# Patient Record
Sex: Male | Born: 1955 | Race: White | Hispanic: No | Marital: Single | Smoking: Current every day smoker
Health system: Southern US, Community
[De-identification: ages and names within clinical notes are randomized; demographics above are authoritative.]

## PROBLEM LIST (undated history)

## (undated) DIAGNOSIS — Z98811 Dental restoration status: Secondary | ICD-10-CM

## (undated) DIAGNOSIS — F329 Major depressive disorder, single episode, unspecified: Secondary | ICD-10-CM

## (undated) DIAGNOSIS — Z8614 Personal history of Methicillin resistant Staphylococcus aureus infection: Secondary | ICD-10-CM

## (undated) DIAGNOSIS — F1010 Alcohol abuse, uncomplicated: Secondary | ICD-10-CM

## (undated) DIAGNOSIS — Z86010 Personal history of colonic polyps: Secondary | ICD-10-CM

## (undated) DIAGNOSIS — R21 Rash and other nonspecific skin eruption: Secondary | ICD-10-CM

## (undated) DIAGNOSIS — K4190 Unilateral femoral hernia, without obstruction or gangrene, not specified as recurrent: Secondary | ICD-10-CM

## (undated) HISTORY — DX: Alcohol abuse, uncomplicated: F10.10

## (undated) HISTORY — PX: INGUINAL HERNIA REPAIR: SUR1180

## (undated) HISTORY — DX: Personal history of colonic polyps: Z86.010

## (undated) HISTORY — DX: Major depressive disorder, single episode, unspecified: F32.9

---

## 2001-08-29 DIAGNOSIS — Z8614 Personal history of Methicillin resistant Staphylococcus aureus infection: Secondary | ICD-10-CM

## 2001-08-29 HISTORY — DX: Personal history of Methicillin resistant Staphylococcus aureus infection: Z86.14

## 2003-08-30 HISTORY — PX: OTHER SURGICAL HISTORY: SHX169

## 2007-07-02 ENCOUNTER — Encounter: Payer: Self-pay | Admitting: Internal Medicine

## 2007-07-02 LAB — CONVERTED CEMR LAB: PSA: 1.12 ng/mL

## 2007-07-12 LAB — HM COLONOSCOPY

## 2007-08-13 ENCOUNTER — Encounter: Payer: Self-pay | Admitting: Internal Medicine

## 2007-09-27 ENCOUNTER — Encounter: Payer: Self-pay | Admitting: Internal Medicine

## 2007-11-15 ENCOUNTER — Encounter: Payer: Self-pay | Admitting: Internal Medicine

## 2008-01-16 ENCOUNTER — Encounter: Payer: Self-pay | Admitting: Internal Medicine

## 2008-04-14 ENCOUNTER — Encounter: Payer: Self-pay | Admitting: Internal Medicine

## 2008-07-09 ENCOUNTER — Encounter: Payer: Self-pay | Admitting: Internal Medicine

## 2009-03-14 ENCOUNTER — Encounter: Payer: Self-pay | Admitting: Internal Medicine

## 2010-05-17 ENCOUNTER — Encounter: Payer: Self-pay | Admitting: Internal Medicine

## 2010-05-17 DIAGNOSIS — F3289 Other specified depressive episodes: Secondary | ICD-10-CM

## 2010-05-17 DIAGNOSIS — F411 Generalized anxiety disorder: Secondary | ICD-10-CM | POA: Insufficient documentation

## 2010-05-17 DIAGNOSIS — F32A Depression, unspecified: Secondary | ICD-10-CM | POA: Insufficient documentation

## 2010-05-17 DIAGNOSIS — F329 Major depressive disorder, single episode, unspecified: Secondary | ICD-10-CM

## 2010-05-17 DIAGNOSIS — Z860100 Personal history of colon polyps, unspecified: Secondary | ICD-10-CM

## 2010-05-17 HISTORY — DX: Personal history of colon polyps, unspecified: Z86.0100

## 2010-05-17 HISTORY — DX: Other specified depressive episodes: F32.89

## 2010-05-17 HISTORY — DX: Major depressive disorder, single episode, unspecified: F32.9

## 2010-05-17 HISTORY — DX: Personal history of colonic polyps: Z86.010

## 2010-06-04 ENCOUNTER — Ambulatory Visit: Payer: Self-pay | Admitting: Internal Medicine

## 2010-06-04 ENCOUNTER — Encounter: Payer: Self-pay | Admitting: Internal Medicine

## 2010-06-04 DIAGNOSIS — F1010 Alcohol abuse, uncomplicated: Secondary | ICD-10-CM

## 2010-06-04 HISTORY — DX: Alcohol abuse, uncomplicated: F10.10

## 2010-06-04 LAB — CONVERTED CEMR LAB
Alkaline Phosphatase: 83 units/L (ref 39–117)
BUN: 15 mg/dL (ref 6–23)
Basophils Absolute: 0 10*3/uL (ref 0.0–0.1)
Bilirubin, Direct: 0.1 mg/dL (ref 0.0–0.3)
CO2: 33 meq/L — ABNORMAL HIGH (ref 19–32)
Calcium: 9.5 mg/dL (ref 8.4–10.5)
Chloride: 104 meq/L (ref 96–112)
Cholesterol: 188 mg/dL (ref 0–200)
Creatinine, Ser: 0.7 mg/dL (ref 0.4–1.5)
Eosinophils Absolute: 0.1 10*3/uL (ref 0.0–0.7)
Glucose, Bld: 61 mg/dL — ABNORMAL LOW (ref 70–99)
Hemoglobin, Urine: NEGATIVE
Ketones, ur: NEGATIVE mg/dL
LDL Cholesterol: 119 mg/dL — ABNORMAL HIGH (ref 0–99)
Lymphocytes Relative: 18.6 % (ref 12.0–46.0)
MCHC: 34.8 g/dL (ref 30.0–36.0)
MCV: 93.5 fL (ref 78.0–100.0)
Monocytes Absolute: 0.6 10*3/uL (ref 0.1–1.0)
Neutrophils Relative %: 73.6 % (ref 43.0–77.0)
PSA: 0.99 ng/mL (ref 0.10–4.00)
Platelets: 195 10*3/uL (ref 150.0–400.0)
RDW: 13.3 % (ref 11.5–14.6)
Specific Gravity, Urine: <=1.005 (ref 1.000–1.030)
Total Protein, Urine: NEGATIVE mg/dL
Total Protein: 6.5 g/dL (ref 6.0–8.3)
Triglycerides: 125 mg/dL (ref 0.0–149.0)
Urine Glucose: NEGATIVE mg/dL
Urobilinogen, UA: 0.2 (ref 0.0–1.0)
VLDL: 25 mg/dL (ref 0.0–40.0)

## 2010-06-14 ENCOUNTER — Ambulatory Visit: Payer: Self-pay | Admitting: Internal Medicine

## 2010-06-14 DIAGNOSIS — H60339 Swimmer's ear, unspecified ear: Secondary | ICD-10-CM

## 2010-09-28 NOTE — Assessment & Plan Note (Signed)
Summary: EAR INFECTION/NWS   Vital Signs:  Patient profile:   55 year old male Height:      71 inches Weight:      147 pounds BMI:     20.58 O2 Sat:      99 % on Room air Temp:     97.8 degrees F oral Pulse rate:   68 / minute BP sitting:   120 / 72  (left arm) Cuff size:   regular  Vitals Entered By: Zella Ball Ewing CMA Duncan Dull) (June 14, 2010 10:26 AM)  O2 Flow:  Room air CC: ear pain/RE   CC:  ear pain/RE.  History of Present Illness: here with acute onset mild to mod x 2 -3 days fever, headache, general malaise and weakness; assoc right ear pain, swelling and mild d/c;  no ST , cough and Pt denies CP, worsening sob, doe, wheezing, orthopnea, pnd, worsening LE edema, palps, dizziness or syncope  Pt denies new neuro symptoms such as headache, facial or extremity weakness  Pt denies polydipsia, polyuria.    Problems Prior to Update: 1)  Preventive Health Care  (ICD-V70.0) 2)  Alcohol Abuse  (ICD-305.00) 3)  Colonic Polyps, Hx of  (ICD-V12.72) 4)  Depression  (ICD-311) 5)  Anxiety  (ICD-300.00)  Medications Prior to Update: 1)  Lexapro 20 Mg Tabs (Escitalopram Oxalate) .... 1/2 Po Once Daily  Current Medications (verified): 1)  Lexapro 20 Mg Tabs (Escitalopram Oxalate) .... 1/2 Po Once Daily 2)  Neomycin-Polymyxin-Hc 3.5-10000-1 Susp (Neomycin-Polymyxin-Hc) .... 2 Gtt Left Ear Four Times Per Day For 10 Days 3)  Ciprofloxacin Hcl 500 Mg Tabs (Ciprofloxacin Hcl) .Marland Kitchen.. 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 , mild ill  Head:  normocephalic and atraumatic.   Eyes:  vision grossly intact, pupils equal, and pupils round.   Ears:  right ext canal with 2+ swelling, redness and d/c;  left canal clear, sinus nontender Nose:  no external deformity and no nasal discharge.   Mouth:  no gingival abnormalities and pharynx pink and moist.   Neck:  supple and no masses.   Lungs:  normal respiratory effort and normal breath sounds.   Heart:  normal rate and regular rhythm.   Extremities:  no edema, no erythema    Impression & Recommendations:  Problem # 1:  OTITIS EXTERNA, ACUTE, RIGHT (ICD-380.12)  His updated medication list for this problem includes:    Neomycin-polymyxin-hc 3.5-10000-1 Susp (Neomycin-polymyxin-hc) .Marland Kitchen... 2 gtt left ear four times per day for 10 days treat as above, f/u any worsening signs or symptoms , and cipro due to severity  Complete Medication List: 1)  Lexapro 20 Mg Tabs (Escitalopram oxalate) .Marland KitchenMarland KitchenMarland Kitchen  1/2 po once daily 2)  Neomycin-polymyxin-hc 3.5-10000-1 Susp (Neomycin-polymyxin-hc) .... 2 gtt left ear four times per day for 10 days 3)  Ciprofloxacin Hcl 500 Mg Tabs (Ciprofloxacin hcl) .Marland Kitchen.. 1po two times a day  Patient Instructions: 1)  Please take all new medications as prescribed 2)  Continue all previous medications as before this visit  3)  Please schedule a follow-up appointment as needed. Prescriptions: CIPROFLOXACIN HCL 500 MG TABS (CIPROFLOXACIN HCL) 1po two times a day  #20 x 0   Entered and Authorized by:   Corwin Levins MD   Signed by:   Corwin Levins MD on 06/14/2010   Method used:   Print then Give to Patient   RxID:    [id number] NEOMYCIN-POLYMYXIN-HC 3.5-10000-1 SUSP [facility]) 2 gtt left ear four times per day for 10 days  #1bottle x 1   Entered and Authorized by:   Corwin Levins MD   Signed by:   Corwin Levins MD on 06/14/2010   Method used:   Print then Give to Patient   RxID:   [id number]    Orders Added: 1)  Est. Patient Level III [84696]

## 2010-09-28 NOTE — Miscellaneous (Signed)
  Clinical Lists Changes  Problems: Added new problem of ANXIETY (ICD-300.00) Added new problem of DEPRESSION (ICD-311) Added new problem of COLONIC POLYPS, HX OF (ICD-V12.72) Observations: Added new observation of FAMILY HX: mother - lung cancer father - unknown  (05/17/2010 13:44) Added new observation of SOCIAL HX: Current Smoker Alcohol use-yes Drug use-no work - [employer]  (05/17/2010 13:44) Added new observation of DRUG USE: no (05/17/2010 13:44) Added new observation of ALCOHOL COMM: yes (05/17/2010 13:44) Added new observation of SMOK STATUS: current (05/17/2010 13:44) Added new observation of INGUINAL HER: yes (05/17/2010 13:44) Added new observation of PAST SURG HX: stress test neg for ischemia  - jan 2005 Inguinal herniorrhaphy  (05/17/2010 13:44) Added new observation of PMH COLONPYP: yes (05/17/2010 13:44) Added new observation of PAST MED HX: Anxiety Depression Colonic polyps, hx of - adenomatous Jul 12, 2007 Nov 2008 - HIV neg, Hep C - neg  (05/17/2010 13:44) Added new observation of DEPRESSION: yes (05/17/2010 13:44) Added new observation of PMH ANXIETY: yes (05/17/2010 13:44) Added new observation of COLONOSCOPY: Adenomatous Polyp (07/12/2007 13:50) Added new observation of PSA: 1.12 ng/mL (07/02/2007 13:51) Added new observation of TD BOOSTER: Tdap  (04/30/2007 13:46)      Preventive Care Screening  Colonoscopy:    Date:  07/12/2007    Results:  Adenomatous Polyp  PSA:    Date:  07/02/2007    Results:  1.12 ng/mL  Last Tetanus Booster:    Date:  04/30/2007    Results:  Tdap    Past History:  Past Medical History: Anxiety Depression Colonic polyps, hx of - adenomatous Jul 12, 2007 Nov 2008 - HIV neg, Hep C - neg  Past Surgical History: stress test neg for ischemia  - jan 2005 Inguinal herniorrhaphy   Family History: mother - lung cancer father - unknown  Social History: Current Smoker Alcohol use-yes 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

## 2010-09-28 NOTE — Assessment & Plan Note (Signed)
Summary: NEW [employer] PT-PKG--STC   Vital Signs:  Patient profile:   55 year old male Height:      71 inches Weight:      147.38 pounds BMI:     20.63 O2 Sat:      92 % on Room air Temp:     97.7 degrees F oral Pulse rate:   81 / minute BP sitting:   108 / 62  (left arm) Cuff size:   regular  Vitals Entered By: Zella Ball Ewing CMA Duncan Dull) (June 04, 2010 9:26 AM)  O2 Flow:  Room air  Preventive Care Screening  Colonoscopy:    Next Due:  06/2012  CC: New Patient, [employer]/RE   CC:  New Patient and [employer]/RE.  History of Present Illness: here to establish with wellness, has ongoing depression, but even lately more agitated, angry, irritable lately  over small things, no suicidal ideation, no panic attacks;  no illict drug use, quite ETOH, still smoking but thinking about quitting smoking.   Was on lexapro - quit himself over a yr ago, then quit his job, lost insurance, and could no longer afford.  Now with health insurance adn his other expensives are currently  lower.  Pt denies CP, worsening sob, doe, wheezing, orthopnea, pnd, worsening LE edema, palps, dizziness or syncope  Pt denies new neuro symptoms such as headache, facial or extremity weakness  Pt denies polydipsia, polyuria  Overall good compliance with meds, trying to follow low chol, DM diet, wt stable, little excercise however .  Cureently lost his driving licesne related to DUI.    Problems Prior to Update: 1)  Preventive Health Care  (ICD-V70.0) 2)  Alcohol Abuse  (ICD-305.00) 3)  Colonic Polyps, Hx of  (ICD-V12.72) 4)  Depression  (ICD-311) 5)  Anxiety  (ICD-300.00)  Medications Prior to Update: 1)  None  Current Medications (verified): 1)  Lexapro 20 Mg Tabs (Escitalopram Oxalate) .... 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 stress test neg for ischemia  - jan 2005 Inguinal herniorrhaphy  Family History: Reviewed history from 05/17/2010 and no changes required. mother - lung cancer father - unknown  Social History: Reviewed history from 05/17/2010 and no changes required. 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  all otherwise negative per pt -    Physical Exam  General:  alert and underweight appearing.   Head:  normocephalic and atraumatic.   Eyes:  vision grossly intact, pupils equal, and pupils round.   Ears:  R ear normal and L ear normal.   Nose:  no external deformity and no nasal discharge.   Mouth:  no gingival abnormalities and pharynx pink and moist.   Neck:  supple and no masses.   Lungs:  normal respiratory effort and  normal breath sounds.   Heart:  normal rate and regular rhythm.   Abdomen:  soft, non-tender, and normal bowel sounds.   Msk:  no joint tenderness and no joint swelling.   Extremities:  no edema, no erythema  Neurologic:  cranial nerves II-XII intact, strength normal in all extremities, and gait normal.   Psych:  dysphoric affect and moderately anxious.     Impression & Recommendations:  Problem # 1:  Preventive Health Care (ICD-V70.0)  Overall doing well, age appropriate education and counseling updated and referral for appropriate preventive services done unless declined, immunizations up to date or declined, diet counseling done if overweight, urged to quit smoking if smokes , most recent labs reviewed and current ordered if appropriate, ecg reviewed or declined (interpretation per ECG scanned in the EMR if done); information regarding Medicare Prevention requirements given if appropriate; speciality referrals updated as appropriate   Orders: EKG w/ Interpretation (93000) TLB-BMP (Basic Metabolic Panel-BMET) (80048-METABOL) TLB-CBC Platelet - w/Differential (85025-CBCD) TLB-Hepatic/Liver Function Pnl (80076-HEPATIC) TLB-TSH (Thyroid Stimulating Hormone) (84443-TSH) TLB-PSA (Prostate Specific Antigen) (84153-PSA) TLB-Lipid Panel (80061-LIPID) TLB-Udip ONLY (81003-UDIP)  Problem # 2:  DEPRESSION (ICD-311)  His updated medication list for this problem includes:    Lexapro 20 Mg Tabs (Escitalopram oxalate) .Marland Kitchen... 1/2 po once daily treat as above, f/u any worsening signs or symptoms   Complete Medication List: 1)  Lexapro 20 Mg Tabs (Escitalopram oxalate) .... 1/2 po once daily  Other Orders: Admin 1st Vaccine (16109) Flu Vaccine 64yrs + [phone])  Patient Instructions: 1)  you had the flu shot today 2)  Please go to the Lab in the basement for your blood and/or urine tests today  3)  Please call the number on the [facility] Card for results of your testing  4)  Please take all new  medications as prescribed 5)  Remember to take only HALF of the lexapro 6)  Please schedule a follow-up appointment in 1 year or sooner if needed Prescriptions: LEXAPRO 20 MG TABS (ESCITALOPRAM OXALATE) 1po once daily  #90 x 3   Entered and Authorized by:   Corwin Levins MD   Signed by:   Corwin Levins MD on 06/04/2010   Method used:   Print then Give to Patient   RxID:   [id number]    Immunization History:  Pneumovax Immunization History:    Pneumovax:  historical (05/29/2008)     Flu Vaccine Consent Questions     Do you have a history of severe allergic reactions to this vaccine? no    Any prior history of allergic reactions to egg and/or gelatin? no    Do you have a sensitivity to the preservative Thimersol? no    Do you have a past history of Guillan-Barre Syndrome? no    Do you currently have an acute febrile illness? no    Have you ever had a severe reaction to latex? no    Vaccine information given and explained to patient? yes  Are you currently pregnant? no    Lot Number:[id number]   Exp Date:02/26/2011   Site Given  Left Deltoid IMu

## 2010-09-28 NOTE — Letter (Signed)
Summary: [facility]   Imported By: Lester [place] 05/19/2010 10:44:50  _____________________________________________________________________  External Attachment:    Type:   Image     Comment:   External Document

## 2010-09-28 NOTE — Letter (Signed)
Summary: [facility]   Imported By: Lester [place] 05/19/2010 10:40:58  _____________________________________________________________________  External Attachment:    Type:   Image     Comment:   External Document

## 2010-09-28 NOTE — Letter (Signed)
Summary: [facility]   Imported By: Lester [place] 05/19/2010 10:43:34  _____________________________________________________________________  External Attachment:    Type:   Image     Comment:   External Document

## 2010-09-28 NOTE — Letter (Signed)
Summary: [facility]   Imported By: Lester [place] 05/19/2010 10:42:22  _____________________________________________________________________  External Attachment:    Type:   Image     Comment:   External Document

## 2011-01-09 ENCOUNTER — Encounter: Payer: Self-pay | Admitting: Internal Medicine

## 2011-01-09 DIAGNOSIS — Z0000 Encounter for general adult medical examination without abnormal findings: Secondary | ICD-10-CM | POA: Insufficient documentation

## 2011-01-13 ENCOUNTER — Ambulatory Visit (INDEPENDENT_AMBULATORY_CARE_PROVIDER_SITE_OTHER): Payer: Managed Care, Other (non HMO) | Admitting: Internal Medicine

## 2011-01-13 ENCOUNTER — Other Ambulatory Visit (INDEPENDENT_AMBULATORY_CARE_PROVIDER_SITE_OTHER): Payer: Managed Care, Other (non HMO)

## 2011-01-13 ENCOUNTER — Encounter: Payer: Self-pay | Admitting: Internal Medicine

## 2011-01-13 VITALS — BP 130/70 | HR 85 | Temp 98.5°F | Ht 71.0 in | Wt 146.5 lb

## 2011-01-13 DIAGNOSIS — R5381 Other malaise: Secondary | ICD-10-CM

## 2011-01-13 DIAGNOSIS — Z0000 Encounter for general adult medical examination without abnormal findings: Secondary | ICD-10-CM

## 2011-01-13 DIAGNOSIS — R5383 Other fatigue: Secondary | ICD-10-CM

## 2011-01-13 DIAGNOSIS — F411 Generalized anxiety disorder: Secondary | ICD-10-CM

## 2011-01-13 DIAGNOSIS — G4700 Insomnia, unspecified: Secondary | ICD-10-CM | POA: Insufficient documentation

## 2011-01-13 LAB — CBC WITH DIFFERENTIAL/PLATELET
Basophils Absolute: 0.1 10*3/uL (ref 0.0–0.1)
Eosinophils Absolute: 0.3 10*3/uL (ref 0.0–0.7)
MCHC: 34.2 g/dL (ref 30.0–36.0)
MCV: 94.2 fl (ref 78.0–100.0)
Monocytes Absolute: 0.8 10*3/uL (ref 0.1–1.0)
Neutrophils Relative %: 66.3 % (ref 43.0–77.0)
Platelets: 187 10*3/uL (ref 150.0–400.0)
RDW: 13.9 % (ref 11.5–14.6)
WBC: 9 10*3/uL (ref 4.5–10.5)

## 2011-01-13 LAB — HEPATIC FUNCTION PANEL
Alkaline Phosphatase: 85 U/L (ref 39–117)
Bilirubin, Direct: 0.1 mg/dL (ref 0.0–0.3)
Total Protein: 6.4 g/dL (ref 6.0–8.3)

## 2011-01-13 LAB — TSH: TSH: 1.34 u[IU]/mL (ref 0.35–5.50)

## 2011-01-13 LAB — BASIC METABOLIC PANEL
BUN: 12 mg/dL (ref 6–23)
Chloride: 104 mEq/L (ref 96–112)
Creatinine, Ser: 0.6 mg/dL (ref 0.4–1.5)
Glucose, Bld: 53 mg/dL — ABNORMAL LOW (ref 70–99)

## 2011-01-13 MED ORDER — ZOLPIDEM TARTRATE 10 MG PO TABS: 10.0000 mg | ORAL_TABLET | Freq: Every evening | ORAL | Status: AC | PRN

## 2011-01-13 NOTE — Patient Instructions (Signed)
Take all new medications as prescribed Continue all other medications as before Please go to LAB in the Basement for the blood and/or urine tests to be done today Please call the phone number [phone] (the PhoneTree System) for results of testing in 2-3 days;  When calling, simply dial the number, and when prompted enter the MRN number above (the Medical Record Number) and the # key, then the message should start. Please return in 6 mo with Lab testing done 3-5 days before

## 2011-01-13 NOTE — Progress Notes (Signed)
Quick Note:  Voice message left on PhoneTree system - lab is negative, normal or otherwise stable, pt to continue same tx ______ 

## 2011-01-13 NOTE — Progress Notes (Signed)
  Subjective:    Patient ID: Jeff Moore, male    DOB: April 16, 1956, 55 y.o.   MRN: [id number]  HPI  Here to f/u with c/o extremem fatigue, works odd hrs at the produce dept at [employer] - sometimes works early am, mid-day hours, and sometimes evening hrs but no overnight hours;  Thinks he does not get enough sleep on the days he works the evenings since he cant go to sleep immed when get home;  Overall worse and "drug out" in the past month especially, very fatigued, "like I'm drunk" for 3-4 hrs in the AM;  Pt denies chest pain, increased sob or doe, wheezing, orthopnea, PND, increased LE swelling, palpitations, dizziness or syncope. Pt denies new neurological symptoms such as new headache, or facial or extremity weakness or numbness  Pt denies polydipsia, polyuria  Pt states overall good compliance with meds, trying to follow lower cholesterol diet,  wt overall stable but little exercise however.   Drinks ETOH very little recently, not more than usual, no other illicit drugs.  Overall good compliance with treatment, and good medicine tolerability. Denies worsening depressive symptoms, suicidal ideation, or panic, states lexapro works well for him.  No overt bleeding or bruising.   Denies urinary symptoms such as dysuria, frequency, urgency,or hematuria.   Past Medical History  Diagnosis Date  . ANXIETY 05/17/2010  . ALCOHOL ABUSE 06/04/2010  . DEPRESSION 05/17/2010  . COLONIC POLYPS, HX OF 05/17/2010  . Preventative health care 01/09/2011   Past Surgical History  Procedure Date  . Inguinal herniorrhapy   . Stress test negative for ischemia Jan. 2005    reports that he has been smoking.  He does not have any smokeless tobacco history on file. He reports that he does not drink alcohol or use illicit drugs. family history includes Cancer in his mother. No Known Allergies Current Outpatient Prescriptions on File Prior to Visit  Medication Sig Dispense Refill  . escitalopram (LEXAPRO) 20 MG tablet 1/2 by  mouth once daily       . ciprofloxacin (CIPRO) 500 MG tablet Take 500 mg by mouth 2 (two) times daily.        Marland Kitchen neomycin-polymyxin b-dexamethasone (MAXITROL) 3.5-10000-0.1 SUSP Place 2 drops into the left ear QID. For 10 days        Review of Systems All otherwise neg per pt     Objective:   Physical Exam BP 130/70  Pulse 85  Temp(Src) 98.5 F (36.9 C) (Oral)  Ht 5\' 11"  (1.803 m)  Wt 146 lb 8 oz (66.452 kg)  BMI 20.43 kg/m2  SpO2 98% Physical Exam  VS noted Constitutional: Pt appears well-developed and well-nourished.  HENT: Head: Normocephalic.  Right Ear: External ear normal.  Left Ear: External ear normal.  Eyes: Conjunctivae and EOM are normal. Pupils are equal, round, and reactive to light.  Neck: Normal range of motion. Neck supple.  Cardiovascular: Normal rate and regular rhythm.   Pulmonary/Chest: Effort normal and breath sounds normal.  Abd:  Soft, NT, non-distended, + BS Neurological: Pt is alert. No cranial nerve deficit.  Skin: Skin is warm. No erythema.  Psychiatric: Pt behavior is normal. Thought content normal. 1+ nervous        Assessment & Plan:

## 2011-01-16 ENCOUNTER — Encounter: Payer: Self-pay | Admitting: Internal Medicine

## 2011-01-16 NOTE — Assessment & Plan Note (Signed)
For tx with lexapro asd

## 2011-01-16 NOTE — Assessment & Plan Note (Addendum)
Etiology unclear, Exam otherwise benign, to check labs as documented, follow with expectant management  

## 2011-01-16 NOTE — Assessment & Plan Note (Signed)
tx with Remus Loffler, likely due to odd hours and stress,  to f/u any worsening symptoms or concerns

## 2011-06-07 ENCOUNTER — Telehealth: Payer: Self-pay

## 2011-06-07 NOTE — Telephone Encounter (Signed)
[facility]. Fluvirin Left Deltoid 0.33ml Date Administered 06/06/2011

## 2011-07-18 ENCOUNTER — Ambulatory Visit: Payer: Managed Care, Other (non HMO) | Admitting: Internal Medicine

## 2011-07-28 ENCOUNTER — Other Ambulatory Visit (INDEPENDENT_AMBULATORY_CARE_PROVIDER_SITE_OTHER): Payer: 59

## 2011-07-28 ENCOUNTER — Encounter: Payer: Self-pay | Admitting: Internal Medicine

## 2011-07-28 ENCOUNTER — Ambulatory Visit (INDEPENDENT_AMBULATORY_CARE_PROVIDER_SITE_OTHER): Payer: 59 | Admitting: Internal Medicine

## 2011-07-28 VITALS — BP 110/80 | HR 74 | Temp 98.4°F | Ht 71.0 in | Wt 145.5 lb

## 2011-07-28 DIAGNOSIS — Z0000 Encounter for general adult medical examination without abnormal findings: Secondary | ICD-10-CM

## 2011-07-28 DIAGNOSIS — R21 Rash and other nonspecific skin eruption: Secondary | ICD-10-CM

## 2011-07-28 LAB — CBC WITH DIFFERENTIAL/PLATELET
Basophils Relative: 0.3 % (ref 0.0–3.0)
Eosinophils Absolute: 0.1 10*3/uL (ref 0.0–0.7)
Eosinophils Relative: 1 % (ref 0.0–5.0)
Lymphocytes Relative: 16 % (ref 12.0–46.0)
Neutrophils Relative %: 76.3 % (ref 43.0–77.0)
Platelets: 207 10*3/uL (ref 150.0–400.0)
RBC: 4.81 Mil/uL (ref 4.22–5.81)
WBC: 10.3 10*3/uL (ref 4.5–10.5)

## 2011-07-28 LAB — LIPID PANEL
Cholesterol: 166 mg/dL (ref 0–200)
LDL Cholesterol: 97 mg/dL (ref 0–99)
VLDL: 9.2 mg/dL (ref 0.0–40.0)

## 2011-07-28 LAB — BASIC METABOLIC PANEL
BUN: 10 mg/dL (ref 6–23)
Chloride: 103 mEq/L (ref 96–112)
Creatinine, Ser: 0.8 mg/dL (ref 0.4–1.5)
Glucose, Bld: 91 mg/dL (ref 70–99)
Potassium: 4.4 mEq/L (ref 3.5–5.1)

## 2011-07-28 LAB — HEPATIC FUNCTION PANEL
ALT: 16 U/L (ref 0–53)
Alkaline Phosphatase: 76 U/L (ref 39–117)
Bilirubin, Direct: 0.1 mg/dL (ref 0.0–0.3)
Total Bilirubin: 0.7 mg/dL (ref 0.3–1.2)
Total Protein: 6.9 g/dL (ref 6.0–8.3)

## 2011-07-28 LAB — URINALYSIS, ROUTINE W REFLEX MICROSCOPIC
Leukocytes, UA: NEGATIVE
Nitrite: NEGATIVE
Specific Gravity, Urine: 1.01 (ref 1.000–1.030)
Total Protein, Urine: NEGATIVE
pH: 6 (ref 5.0–8.0)

## 2011-07-28 MED ORDER — ASPIRIN EC 81 MG PO TBEC: 81.0000 mg | DELAYED_RELEASE_TABLET | Freq: Every day | ORAL | Status: AC

## 2011-07-28 MED ORDER — ESCITALOPRAM OXALATE 20 MG PO TABS: 10.0000 mg | ORAL_TABLET | Freq: Every day | ORAL | Status: DC

## 2011-07-28 MED ORDER — TRIAMCINOLONE ACETONIDE 0.1 % EX CREA: TOPICAL_CREAM | Freq: Two times a day (BID) | CUTANEOUS | Status: AC

## 2011-07-28 NOTE — Patient Instructions (Addendum)
Take all new medications as prescribed - the cream, and the Aspirin 81 mg - 1 per day - COATED only Continue all other medications as before Please go to LAB in the Basement for the blood and/or urine tests to be done today Please call the phone number [phone] (the PhoneTree System) for results of testing in 2-3 days;  When calling, simply dial the number, and when prompted enter the MRN number above (the Medical Record Number) and the # key, then the message should start. Please return in 1 year for your yearly visit, or sooner if needed, with Lab testing done 3-5 days before

## 2011-07-31 ENCOUNTER — Encounter: Payer: Self-pay | Admitting: Internal Medicine

## 2011-07-31 DIAGNOSIS — R21 Rash and other nonspecific skin eruption: Secondary | ICD-10-CM | POA: Insufficient documentation

## 2011-07-31 NOTE — Progress Notes (Signed)
Subjective:    Patient ID: Jeff Moore, male    DOB: 07-21-1956, 55 y.o.   MRN: [id number]  HPI Here for wellness and f/u;  Overall doing ok;  Pt denies CP, worsening SOB, DOE, wheezing, orthopnea, PND, worsening LE edema, palpitations, dizziness or syncope.  Pt denies neurological change such as new Headache, facial or extremity weakness.  Pt denies polydipsia, polyuria, or low sugar symptoms. Pt states overall good compliance with treatment and medications, good tolerability, and trying to follow lower cholesterol diet.  Pt denies worsening depressive symptoms, suicidal ideation or panic. No fever, wt loss, night sweats, loss of appetite, or other constitutional symptoms.  Pt states good ability with ADL's, low fall risk, home safety reviewed and adequate, no significant changes in hearing or vision, and occasionally active with exercise.  Fatigue from last visit largely resolved, since he is now on dayshift.  Does have a rash rather large area to mid upper forehead, nontender, mild itchy Past Medical History  Diagnosis Date  . ANXIETY 05/17/2010  . ALCOHOL ABUSE 06/04/2010  . DEPRESSION 05/17/2010  . COLONIC POLYPS, HX OF 05/17/2010  . Preventative health care 01/09/2011   Past Surgical History  Procedure Date  . Inguinal herniorrhapy   . Stress test negative for ischemia Jan. 2005    reports that he has been smoking.  He does not have any smokeless tobacco history on file. He reports that he does not drink alcohol or use illicit drugs. family history includes Cancer in his mother. No Known Allergies No current outpatient prescriptions on file prior to visit.   Review of Systems Review of Systems  Constitutional: Negative for diaphoresis, activity change, appetite change and unexpected weight change.  HENT: Negative for hearing loss, ear pain, facial swelling, mouth sores and neck stiffness.   Eyes: Negative for pain, redness and visual disturbance.  Respiratory: Negative for shortness of  breath and wheezing.   Cardiovascular: Negative for chest pain and palpitations.  Gastrointestinal: Negative for diarrhea, blood in stool, abdominal distention and rectal pain.  Genitourinary: Negative for hematuria, flank pain and decreased urine volume.  Musculoskeletal: Negative for myalgias and joint swelling.  Skin: Negative for color change and wound.  Neurological: Negative for syncope and numbness.  Hematological: Negative for adenopathy.  Psychiatric/Behavioral: Negative for hallucinations, self-injury, decreased concentration and agitation.      Objective:   Physical Exam BP 110/80  Pulse 74  Temp(Src) 98.4 F (36.9 C) (Oral)  Ht 5\' 11"  (1.803 m)  Wt 145 lb 8 oz (65.998 kg)  BMI 20.29 kg/m2  SpO2 96% Physical Exam  VS noted Constitutional: Pt is oriented to person, place, and time. Appears well-developed and well-nourished.  HENT:  Head: Normocephalic and atraumatic.  Right Ear: External ear normal.  Left Ear: External ear normal.  Nose: Nose normal.  Mouth/Throat: Oropharynx is clear and moist.  Eyes: Conjunctivae and EOM are normal. Pupils are equal, round, and reactive to light.  Neck: Normal range of motion. Neck supple. No JVD present. No tracheal deviation present.  Cardiovascular: Normal rate, regular rhythm, normal heart sounds and intact distal pulses.   Pulmonary/Chest: Effort normal and breath sounds normal.  Abdominal: Soft. Bowel sounds are normal. There is no tenderness.  Musculoskeletal: Normal range of motion. Exhibits no edema.  Lymphadenopathy:  Has no cervical adenopathy.  Neurological: Pt is alert and oriented to person, place, and time. Pt has normal reflexes. No cranial nerve deficit.  Skin: Skin is warm and dry. No rash noted. except for  approx 3 cm erythema/pink nontender mid upper forehead without ulcer Psychiatric:  Has  normal mood and affect. Behavior is normal.     Assessment & Plan:

## 2011-07-31 NOTE — Assessment & Plan Note (Signed)
For triam cream, consider derm referral

## 2011-07-31 NOTE — Assessment & Plan Note (Signed)

## 2012-05-28 ENCOUNTER — Ambulatory Visit (INDEPENDENT_AMBULATORY_CARE_PROVIDER_SITE_OTHER): Payer: 59 | Admitting: Internal Medicine

## 2012-05-28 ENCOUNTER — Encounter (HOSPITAL_COMMUNITY): Payer: Self-pay | Admitting: Emergency Medicine

## 2012-05-28 ENCOUNTER — Encounter: Payer: Self-pay | Admitting: Internal Medicine

## 2012-05-28 ENCOUNTER — Emergency Department (HOSPITAL_COMMUNITY): Payer: 59

## 2012-05-28 ENCOUNTER — Emergency Department (HOSPITAL_COMMUNITY)
Admission: EM | Admit: 2012-05-28 | Discharge: 2012-05-28 | Disposition: A | Discharge status: Home / Self Care | Payer: 59 | Attending: Emergency Medicine | Admitting: Emergency Medicine

## 2012-05-28 VITALS — BP 112/80 | HR 87 | Temp 98.2°F | Ht 71.0 in | Wt 134.0 lb

## 2012-05-28 DIAGNOSIS — F411 Generalized anxiety disorder: Secondary | ICD-10-CM | POA: Insufficient documentation

## 2012-05-28 DIAGNOSIS — F329 Major depressive disorder, single episode, unspecified: Secondary | ICD-10-CM | POA: Insufficient documentation

## 2012-05-28 DIAGNOSIS — IMO0001 Reserved for inherently not codable concepts without codable children: Secondary | ICD-10-CM | POA: Insufficient documentation

## 2012-05-28 DIAGNOSIS — K4090 Unilateral inguinal hernia, without obstruction or gangrene, not specified as recurrent: Secondary | ICD-10-CM

## 2012-05-28 DIAGNOSIS — K4130 Unilateral femoral hernia, with obstruction, without gangrene, not specified as recurrent: Secondary | ICD-10-CM

## 2012-05-28 DIAGNOSIS — Z7982 Long term (current) use of aspirin: Secondary | ICD-10-CM | POA: Insufficient documentation

## 2012-05-28 DIAGNOSIS — Z79899 Other long term (current) drug therapy: Secondary | ICD-10-CM | POA: Insufficient documentation

## 2012-05-28 DIAGNOSIS — F3289 Other specified depressive episodes: Secondary | ICD-10-CM | POA: Insufficient documentation

## 2012-05-28 LAB — BASIC METABOLIC PANEL
BUN: 10 mg/dL (ref 6–23)
Calcium: 9 mg/dL (ref 8.4–10.5)
Creatinine, Ser: 0.72 mg/dL (ref 0.50–1.35)
GFR calc non Af Amer: >90 mL/min (ref >90)
Glucose, Bld: 100 mg/dL — ABNORMAL HIGH (ref 70–99)

## 2012-05-28 LAB — CBC WITH DIFFERENTIAL/PLATELET
Eosinophils Absolute: 0.1 10*3/uL (ref 0.0–0.7)
Eosinophils Relative: 2 % (ref 0–5)
Hemoglobin: 14.3 g/dL (ref 13.0–17.0)
Lymphs Abs: 1.6 10*3/uL (ref 0.7–4.0)
MCH: 32 pg (ref 26.0–34.0)
MCHC: 34.4 g/dL (ref 30.0–36.0)
MCV: 93.1 fL (ref 78.0–100.0)
Monocytes Absolute: 0.8 10*3/uL (ref 0.1–1.0)
Monocytes Relative: 13 % — ABNORMAL HIGH (ref 3–12)
RBC: 4.47 MIL/uL (ref 4.22–5.81)

## 2012-05-28 MED ORDER — OXYCODONE HCL 5 MG PO TABS: ORAL_TABLET | ORAL | Status: DC

## 2012-05-28 MED ORDER — IBUPROFEN 600 MG PO TABS: 600.0000 mg | ORAL_TABLET | Freq: Three times a day (TID) | ORAL | Status: AC | PRN

## 2012-05-28 MED ORDER — HYDROMORPHONE HCL PF 1 MG/ML IJ SOLN
1.0000 mg | Freq: Once | INTRAMUSCULAR | Status: AC
  Administered 2012-05-28: 1 mg via INTRAVENOUS
  Filled 2012-05-28: qty 1

## 2012-05-28 MED ORDER — ONDANSETRON HCL 4 MG/2ML IJ SOLN
4.0000 mg | Freq: Once | INTRAMUSCULAR | Status: AC
  Administered 2012-05-28: 4 mg via INTRAVENOUS
  Filled 2012-05-28: qty 2

## 2012-05-28 MED ORDER — IOHEXOL 300 MG/ML  SOLN
100.0000 mL | Freq: Once | INTRAMUSCULAR | Status: AC | PRN
  Administered 2012-05-28: 100 mL via INTRAVENOUS

## 2012-05-28 MED ORDER — SODIUM CHLORIDE 0.9 % IV BOLUS (SEPSIS)
1000.0000 mL | Freq: Once | INTRAVENOUS | Status: AC
  Administered 2012-05-28: 1000 mL via INTRAVENOUS

## 2012-05-28 NOTE — ED Provider Notes (Signed)
History     CSN: [id number]  Arrival date & time 05/28/12  1602   First MD Initiated Contact with Patient 05/28/12 1917      Chief Complaint  Patient presents with  . Inguinal Hernia    (Consider location/radiation/quality/duration/timing/severity/associated sxs/prior treatment) HPI The patient presents with concerns of a new painful left inguinal mass.  Symptoms began 2 days ago, insidiously.  Since onset symptoms have become more painful, more prominent.  Has no discoloration, no concurrent nausea, vomiting, fever, chills.  As the symptoms have persisted, they have not improved with OTC medication.  He saw his primary care physician today was referred here for evaluation. The patient has a notable history of right sided inguinal hernia.  Past Medical History  Diagnosis Date  . ANXIETY 05/17/2010  . ALCOHOL ABUSE 06/04/2010  . DEPRESSION 05/17/2010  . COLONIC POLYPS, HX OF 05/17/2010  . Preventative health care 01/09/2011    Past Surgical History  Procedure Date  . Inguinal herniorrhapy   . Stress test negative for ischemia Jan. 2005    Family History  Problem Relation Age of Onset  . Cancer Mother     Lung cancer    History  Substance Use Topics  . Smoking status: Current Every Day Smoker  . Smokeless tobacco: Not on file  . Alcohol Use: No     Former Heavy quit may 2011      Review of Systems  Constitutional:       Per HPI, otherwise negative  HENT:       Per HPI, otherwise negative  Eyes: Negative.   Respiratory:       Per HPI, otherwise negative  Cardiovascular:       Per HPI, otherwise negative  Gastrointestinal: Negative for vomiting.  Genitourinary: Negative.   Musculoskeletal:       Per HPI, otherwise negative  Skin: Negative.   Neurological: Negative for syncope.    Allergies  Review of patient's allergies indicates no known allergies.  Home Medications   Current Outpatient Rx  Name Route Sig Dispense Refill  . ASPIRIN EC 81 MG PO TBEC  Oral Take 1 tablet (81 mg total) by mouth daily. 150 tablet 2  . ESCITALOPRAM OXALATE 20 MG PO TABS Oral Take 0.5 tablets (10 mg total) by mouth daily. 1/2 by mouth once daily 45 tablet 3  . OXYCODONE HCL 5 MG PO TABS  1-2 tabs by mouth every 4-6 hours as needed for pain 50 tablet 0  . TRIAMCINOLONE ACETONIDE 0.1 % EX CREA Topical Apply topically 2 (two) times daily. 30 g 0    BP 147/84  Pulse 64  Temp 97.8 F (36.6 C) (Oral)  Resp 16  SpO2 100%  Physical Exam  Nursing note and vitals reviewed. Constitutional: He is oriented to person, place, and time. He appears well-developed. No distress.  HENT:  Head: Normocephalic and atraumatic.  Eyes: Conjunctivae normal and EOM are normal.  Cardiovascular: Normal rate and regular rhythm.   Pulmonary/Chest: Effort normal. No stridor. No respiratory distress.  Abdominal: Soft. He exhibits no distension.  Genitourinary: Testes normal. Right testis shows no mass, no swelling and no tenderness. Left testis shows no mass, no swelling and no tenderness. Circumcised.       The mass is ttp, most c/w LN, but w surrounding nodes.   Musculoskeletal: He exhibits no edema.  Neurological: He is alert and oriented to person, place, and time.  Skin: Skin is warm and dry.  Psychiatric: He has a  normal mood and affect.    ED Course  Procedures (including critical care time)  Labs Reviewed  CBC WITH DIFFERENTIAL - Abnormal; Notable for the following:    Monocytes Relative 13 (*)     All other components within normal limits  BASIC METABOLIC PANEL - Abnormal; Notable for the following:    Glucose, Bld 100 (*)     All other components within normal limits  LACTIC ACID, PLASMA   No results found.   No diagnosis found.    MDM  The patient presents for concerns over new left inguinal mass.  On exam there is a tender area most consistent with femoral incarcerated hernia.  Absent distress, other abdominal pain, fever there is low suspicion for  intestinal involvement.  The patient's CAT scan was consistent with a femoral hernia with incarcerated tissue.  I discussed the case with surgery, and the patient was deemed appropriate for next day follow-up.      Gerhard Munch, MD 05/29/12 [phone]

## 2012-05-28 NOTE — ED Notes (Signed)
Pt noticed a knot in his left inguinal area on Saturday.  Went to his MD this morning and was dx with an inguinal hernia and told to come to the ED to be seen.  Pt states that he is unable to push the hernia back in and that he was told it is possibly stuck in his muscle wall.

## 2012-05-28 NOTE — ED Notes (Signed)
States has had an inguinal hernia about 20 years ago, repaired. Swelling started Saturday in left groin area -- no lifting or pulling injury, was told to come here from dr's office.

## 2012-05-28 NOTE — Patient Instructions (Addendum)
Take all new medications as prescribed Continue all other medications as before You will be contacted regarding the referral for: general surgury  - asap  Add:  Pt to go to ER after d/w nurse at [facility] for ? Incarcerated [facility]

## 2012-05-28 NOTE — Assessment & Plan Note (Signed)
Mod to severe tender, nonreducible, for urgent surgical referral, pain med

## 2012-05-28 NOTE — Progress Notes (Signed)
  Subjective:    Patient ID: Jeff Moore, male    DOB: June 18, 1956, 56 y.o.   MRN: [id number]  HPI  Here with acute onset persistent nonremitting pain and swelling to the left inguinal area since yesterday,  Denies worsening reflux, dysphagia, abd pain, n/v, bowel change or blood.  No fever,  Has hx of RIH repair but prior to that had reducible minimal tender swelling.   Past Medical History  Diagnosis Date  . ANXIETY 05/17/2010  . ALCOHOL ABUSE 06/04/2010  . DEPRESSION 05/17/2010  . COLONIC POLYPS, HX OF 05/17/2010  . Preventative health care 01/09/2011   Past Surgical History  Procedure Date  . Inguinal herniorrhapy   . Stress test negative for ischemia Jan. 2005    reports that he has been smoking.  He does not have any smokeless tobacco history on file. He reports that he does not drink alcohol or use illicit drugs. family history includes Cancer in his mother. No Known Allergies No current facility-administered medications on file prior to visit.   Current Outpatient Prescriptions on File Prior to Visit  Medication Sig Dispense Refill  . aspirin EC 81 MG tablet Take 1 tablet (81 mg total) by mouth daily.  150 tablet  2  . escitalopram (LEXAPRO) 20 MG tablet Take 0.5 tablets (10 mg total) by mouth daily. 1/2 by mouth once daily  45 tablet  3  . triamcinolone cream (KENALOG) 0.1 % Apply topically 2 (two) times daily.  30 g  0   Review of Systems All otherwise neg per pt     Objective:   Physical Exam BP 112/80  Pulse 87  Temp 98.2 F (36.8 C) (Oral)  Ht 5\' 11"  (1.803 m)  Wt 134 lb (60.782 kg)  BMI 18.69 kg/m2  SpO2 96% Physical Exam  VS noted Constitutional: Pt appears well-developed and well-nourished.  HENT: Head: Normocephalic.  Eyes: Conjunctivae and EOM are normal. Pupils are equal, round, and reactive to light.  Neck: Normal range of motion. Neck supple.  Cardiovascular: Normal rate and regular rhythm.   Pulmonary/Chest: Effort normal and breath sounds normal.    Abd:  Soft, NT, non-distended, + BS RIH scar noted Left inguinal marked swelling/tender nonreducible Neurological: Pt is alert. Not confused      Assessment & Plan:

## 2012-05-28 NOTE — ED Notes (Signed)
Patient is alert and oriented x3.  He was given DC instructions and follow up visit instructions.  Patient gave verbal understanding.  He was DC ambulatory under his own power to home.  V/S stable.  He was not showing any signs of distress on DC 

## 2012-05-29 DIAGNOSIS — K4190 Unilateral femoral hernia, without obstruction or gangrene, not specified as recurrent: Secondary | ICD-10-CM

## 2012-05-29 HISTORY — DX: Unilateral femoral hernia, without obstruction or gangrene, not specified as recurrent: K41.90

## 2012-05-31 ENCOUNTER — Ambulatory Visit (INDEPENDENT_AMBULATORY_CARE_PROVIDER_SITE_OTHER): Payer: 59 | Admitting: General Surgery

## 2012-05-31 ENCOUNTER — Encounter (INDEPENDENT_AMBULATORY_CARE_PROVIDER_SITE_OTHER): Payer: Self-pay | Admitting: General Surgery

## 2012-05-31 VITALS — BP 98/62 | HR 76 | Temp 97.9°F | Resp 16 | Ht 71.0 in | Wt 135.2 lb

## 2012-05-31 DIAGNOSIS — K4190 Unilateral femoral hernia, without obstruction or gangrene, not specified as recurrent: Secondary | ICD-10-CM | POA: Insufficient documentation

## 2012-05-31 NOTE — Patient Instructions (Signed)
Plan for left femoral hernia repair with mesh

## 2012-06-01 NOTE — Progress Notes (Signed)
Subjective:     Patient ID: Jeff Moore, male   DOB: November 14, 1955, 56 y.o.   MRN: [id number]  HPI We're asked to see the patient in consultation by Dr. Jonny Ruiz to evaluate him for a left inguinal hernia. The patient is a 56 year old white male who got out of bed last Saturday and felt some pain in the left groin when he got up. The pain has been persistent. He denies any nausea or vomiting. He denies any fevers or chills. He has had some issues with constipation. The pain was bad enough that he went to the emergency department where they obtained a CT scan. The CT scan showed a femoral hernia on the left. There were some fat in the hernia but no involvement of intestines. The patient does have a history of a right inguinal hernia done in the 1980s.  Review of Systems  Constitutional: Negative.   HENT: Negative.   Eyes: Negative.   Respiratory: Negative.   Cardiovascular: Negative.   Gastrointestinal: Positive for abdominal pain. Negative for abdominal distention.  Genitourinary: Negative.   Musculoskeletal: Negative.   Skin: Negative.   Neurological: Negative.   Hematological: Negative.   Psychiatric/Behavioral: Negative.        Objective:   Physical Exam  Constitutional: He is oriented to person, place, and time. He appears well-developed and well-nourished.  HENT:  Head: Normocephalic and atraumatic.  Eyes: Conjunctivae normal and EOM are normal. Pupils are equal, round, and reactive to light.  Neck: Normal range of motion. Neck supple.  Cardiovascular: Normal rate, regular rhythm and normal heart sounds.   Pulmonary/Chest: Effort normal and breath sounds normal.  Abdominal: Soft. Bowel sounds are normal.  Genitourinary:       The patient has a palpable bulge just below the left inguinal ligament medial to the femoral artery that is tender and does not reduce. There is no cellulitis. There is no abdominal distention.  Musculoskeletal: Normal range of motion.  Neurological: He is  alert and oriented to person, place, and time.  Skin: Skin is warm and dry.  Psychiatric: He has a normal mood and affect. His behavior is normal.       Assessment:     The patient appears to have an incarcerated left femoral hernia involving just some fat    Plan:     Because of the risk of incarceration strangulation or think he would benefit from having this hernia fixed. I have discussed with him in detail the risks and benefits of the operation to fix the hernia as well as some of the technical aspects including use of mesh and he understands and wishes to proceed. We will plan to do this form in the near future.

## 2012-06-07 ENCOUNTER — Encounter (HOSPITAL_BASED_OUTPATIENT_CLINIC_OR_DEPARTMENT_OTHER): Payer: Self-pay | Admitting: *Deleted

## 2012-06-07 ENCOUNTER — Telehealth (INDEPENDENT_AMBULATORY_CARE_PROVIDER_SITE_OTHER): Payer: Self-pay

## 2012-06-07 ENCOUNTER — Telehealth: Payer: Self-pay | Admitting: Internal Medicine

## 2012-06-07 DIAGNOSIS — R21 Rash and other nonspecific skin eruption: Secondary | ICD-10-CM

## 2012-06-07 HISTORY — DX: Rash and other nonspecific skin eruption: R21

## 2012-06-07 MED ORDER — OXYCODONE-ACETAMINOPHEN 5-325 MG PO TABS: 1.0000 | ORAL_TABLET | ORAL | Status: DC | PRN

## 2012-06-07 NOTE — Telephone Encounter (Signed)
The patient called back to check on his refill.  I told him it was denied by one of our other surgeons (Dr Abbey Chatters).  He is unclear why.  I told him the surgeons don't typically prescribe preop medicine.  He asked why Dr Carolynne Edouard did and I told him I didn't know the answer to that.  He said he is in a lot of pain because he doesn't just have a hernia but he has fat through his abominal wall.  I asked him to call his primary care doctor to see if they can help him.

## 2012-06-07 NOTE — Telephone Encounter (Signed)
The patient is preop hernia repair and wants more Percocet.  He said Dr Carolynne Edouard gave him some.  His surgery is 10/16.  We will address this with the urgent clinic doctor today as Dr Carolynne Edouard is unavailable.

## 2012-06-07 NOTE — Telephone Encounter (Signed)
Done hardcopy to robin  

## 2012-06-07 NOTE — Telephone Encounter (Signed)
Pt went to the er Monday a week ago.  Having surgery 10-16.  The surgeon is Dr Margretta Ditty.  He gave him a weeks supply of percocet but won't refill it.  Pt is requesting a refill on his pain medicine.

## 2012-06-08 NOTE — Telephone Encounter (Signed)
Called the patient left detailed message prescription requested has been filled and ready to pickup at the front desk.

## 2012-06-13 ENCOUNTER — Encounter (HOSPITAL_BASED_OUTPATIENT_CLINIC_OR_DEPARTMENT_OTHER): Payer: Self-pay | Admitting: Anesthesiology

## 2012-06-13 ENCOUNTER — Encounter (HOSPITAL_BASED_OUTPATIENT_CLINIC_OR_DEPARTMENT_OTHER): Payer: Self-pay | Admitting: *Deleted

## 2012-06-13 ENCOUNTER — Encounter (HOSPITAL_BASED_OUTPATIENT_CLINIC_OR_DEPARTMENT_OTHER)
Admission: RE | Disposition: A | Discharge status: Home / Self Care | Payer: Self-pay | Source: Ambulatory Visit | Attending: General Surgery

## 2012-06-13 ENCOUNTER — Ambulatory Visit (HOSPITAL_BASED_OUTPATIENT_CLINIC_OR_DEPARTMENT_OTHER)
Admission: RE | Admit: 2012-06-13 | Discharge: 2012-06-13 | Disposition: A | Discharge status: Home / Self Care | Payer: 59 | Source: Ambulatory Visit | Attending: General Surgery | Admitting: General Surgery

## 2012-06-13 ENCOUNTER — Ambulatory Visit (HOSPITAL_BASED_OUTPATIENT_CLINIC_OR_DEPARTMENT_OTHER): Payer: 59 | Admitting: Anesthesiology

## 2012-06-13 DIAGNOSIS — J4489 Other specified chronic obstructive pulmonary disease: Secondary | ICD-10-CM | POA: Insufficient documentation

## 2012-06-13 DIAGNOSIS — K4190 Unilateral femoral hernia, without obstruction or gangrene, not specified as recurrent: Secondary | ICD-10-CM

## 2012-06-13 DIAGNOSIS — F17200 Nicotine dependence, unspecified, uncomplicated: Secondary | ICD-10-CM | POA: Insufficient documentation

## 2012-06-13 DIAGNOSIS — J449 Chronic obstructive pulmonary disease, unspecified: Secondary | ICD-10-CM | POA: Insufficient documentation

## 2012-06-13 HISTORY — DX: Dental restoration status: Z98.811

## 2012-06-13 HISTORY — DX: Rash and other nonspecific skin eruption: R21

## 2012-06-13 HISTORY — DX: Personal history of Methicillin resistant Staphylococcus aureus infection: Z86.14

## 2012-06-13 HISTORY — PX: FEMORAL HERNIA REPAIR: SHX632

## 2012-06-13 HISTORY — DX: Unilateral femoral hernia, without obstruction or gangrene, not specified as recurrent: K41.90

## 2012-06-13 SURGERY — HERNIA REPAIR FEMORAL
Anesthesia: General | Site: Abdomen | Laterality: Left | Wound class: Clean

## 2012-06-13 MED ORDER — LIDOCAINE HCL 4 % MT SOLN
OROMUCOSAL | Status: DC | PRN
  Administered 2012-06-13: 4 mL via TOPICAL

## 2012-06-13 MED ORDER — ROCURONIUM BROMIDE 100 MG/10ML IV SOLN
INTRAVENOUS | Status: DC | PRN
  Administered 2012-06-13: 40 mg via INTRAVENOUS
  Administered 2012-06-13: 10 mg via INTRAVENOUS

## 2012-06-13 MED ORDER — OXYCODONE HCL 5 MG PO TABS
5.0000 mg | ORAL_TABLET | Freq: Once | ORAL | Status: AC | PRN
  Administered 2012-06-13: 5 mg via ORAL

## 2012-06-13 MED ORDER — PROMETHAZINE HCL 25 MG/ML IJ SOLN: 6.2500 mg | INTRAMUSCULAR | Status: DC | PRN

## 2012-06-13 MED ORDER — MIDAZOLAM HCL 2 MG/2ML IJ SOLN
1.0000 mg | INTRAMUSCULAR | Status: DC | PRN
  Administered 2012-06-13: 2 mg via INTRAVENOUS

## 2012-06-13 MED ORDER — CEFAZOLIN SODIUM-DEXTROSE 2-3 GM-% IV SOLR
2.0000 g | INTRAVENOUS | Status: AC
  Administered 2012-06-13: 2 g via INTRAVENOUS

## 2012-06-13 MED ORDER — CHLORHEXIDINE GLUCONATE 4 % EX LIQD: 1.0000 "application " | Freq: Once | CUTANEOUS | Status: DC

## 2012-06-13 MED ORDER — ONDANSETRON HCL 4 MG PO TABS: 4.0000 mg | ORAL_TABLET | Freq: Four times a day (QID) | ORAL | Status: DC | PRN

## 2012-06-13 MED ORDER — ONDANSETRON HCL 4 MG/2ML IJ SOLN
INTRAMUSCULAR | Status: DC | PRN
  Administered 2012-06-13: 4 mg via INTRAVENOUS

## 2012-06-13 MED ORDER — PROPOFOL 10 MG/ML IV BOLUS
INTRAVENOUS | Status: DC | PRN
  Administered 2012-06-13: 180 mg via INTRAVENOUS

## 2012-06-13 MED ORDER — LIDOCAINE HCL (CARDIAC) 20 MG/ML IV SOLN
INTRAVENOUS | Status: DC | PRN
  Administered 2012-06-13: 80 mg via INTRAVENOUS

## 2012-06-13 MED ORDER — GLYCOPYRROLATE 0.2 MG/ML IJ SOLN
INTRAMUSCULAR | Status: DC | PRN
  Administered 2012-06-13: .4 mg via INTRAVENOUS

## 2012-06-13 MED ORDER — HYDROMORPHONE HCL PF 1 MG/ML IJ SOLN
0.2500 mg | INTRAMUSCULAR | Status: DC | PRN
  Administered 2012-06-13 (×2): 0.5 mg via INTRAVENOUS

## 2012-06-13 MED ORDER — FENTANYL CITRATE 0.05 MG/ML IJ SOLN
INTRAMUSCULAR | Status: DC | PRN
  Administered 2012-06-13: 100 ug via INTRAVENOUS

## 2012-06-13 MED ORDER — LACTATED RINGERS IV SOLN
INTRAVENOUS | Status: DC
  Administered 2012-06-13 (×2): via INTRAVENOUS

## 2012-06-13 MED ORDER — NEOSTIGMINE METHYLSULFATE 1 MG/ML IJ SOLN
INTRAMUSCULAR | Status: DC | PRN
  Administered 2012-06-13: 3 mg via INTRAVENOUS

## 2012-06-13 MED ORDER — ONDANSETRON HCL 4 MG/2ML IJ SOLN: 4.0000 mg | Freq: Four times a day (QID) | INTRAMUSCULAR | Status: DC | PRN

## 2012-06-13 MED ORDER — DEXAMETHASONE SODIUM PHOSPHATE 4 MG/ML IJ SOLN
INTRAMUSCULAR | Status: DC | PRN
  Administered 2012-06-13: 10 mg via INTRAVENOUS

## 2012-06-13 MED ORDER — OXYCODONE HCL 5 MG/5ML PO SOLN: 5.0000 mg | Freq: Once | ORAL | Status: AC | PRN

## 2012-06-13 MED ORDER — HYDROCODONE-ACETAMINOPHEN 5-325 MG PO TABS: 1.0000 | ORAL_TABLET | ORAL | Status: DC | PRN

## 2012-06-13 MED ORDER — OXYCODONE-ACETAMINOPHEN 5-325 MG PO TABS: 1.0000 | ORAL_TABLET | ORAL | Status: DC | PRN

## 2012-06-13 MED ORDER — BUPIVACAINE-EPINEPHRINE 0.25% -1:200000 IJ SOLN
INTRAMUSCULAR | Status: DC | PRN
  Administered 2012-06-13: 10 mL

## 2012-06-13 MED ORDER — DEXAMETHASONE SODIUM PHOSPHATE 4 MG/ML IJ SOLN: INTRAMUSCULAR | Status: DC | PRN

## 2012-06-13 MED ORDER — FENTANYL CITRATE 0.05 MG/ML IJ SOLN
50.0000 ug | Freq: Once | INTRAMUSCULAR | Status: AC
  Administered 2012-06-13: 100 ug via INTRAVENOUS

## 2012-06-13 MED ORDER — BUPIVACAINE-EPINEPHRINE PF 0.5-1:200000 % IJ SOLN
INTRAMUSCULAR | Status: DC | PRN
  Administered 2012-06-13: 20 mL

## 2012-06-13 MED ORDER — CEFAZOLIN SODIUM-DEXTROSE 2-3 GM-% IV SOLR: 2.0000 g | INTRAVENOUS | Status: DC

## 2012-06-13 SURGICAL SUPPLY — 48 items
BENZOIN TINCTURE PRP APPL 2/3 (GAUZE/BANDAGES/DRESSINGS) IMPLANT
BLADE HEX COATED 2.75 (ELECTRODE) ×2 IMPLANT
BLADE SURG 15 STRL LF DISP TIS (BLADE) ×1 IMPLANT
BLADE SURG 15 STRL SS (BLADE) ×1
BLADE SURG ROTATE 9660 (MISCELLANEOUS) ×2 IMPLANT
CHLORAPREP W/TINT 26ML (MISCELLANEOUS) ×2 IMPLANT
CLOTH BEACON ORANGE TIMEOUT ST (SAFETY) ×2 IMPLANT
COVER MAYO STAND STRL (DRAPES) ×2 IMPLANT
COVER TABLE BACK 60X90 (DRAPES) ×2 IMPLANT
DECANTER SPIKE VIAL GLASS SM (MISCELLANEOUS) IMPLANT
DERMABOND ADVANCED (GAUZE/BANDAGES/DRESSINGS) ×1
DERMABOND ADVANCED .7 DNX12 (GAUZE/BANDAGES/DRESSINGS) ×1 IMPLANT
DRAIN PENROSE 1/2X12 LTX STRL (WOUND CARE) ×2 IMPLANT
DRAPE LAPAROTOMY TRNSV 102X78 (DRAPE) ×2 IMPLANT
DRAPE UTILITY XL STRL (DRAPES) ×2 IMPLANT
ELECT REM PT RETURN 9FT ADLT (ELECTROSURGICAL) ×2
ELECTRODE REM PT RTRN 9FT ADLT (ELECTROSURGICAL) ×1 IMPLANT
GLOVE BIO SURGEON STRL SZ7.5 (GLOVE) ×2 IMPLANT
GLOVE BIOGEL M STRL SZ7.5 (GLOVE) ×2 IMPLANT
GLOVE BIOGEL PI IND STRL 8 (GLOVE) ×1 IMPLANT
GLOVE BIOGEL PI INDICATOR 8 (GLOVE) ×1
GOWN PREVENTION PLUS XLARGE (GOWN DISPOSABLE) ×4 IMPLANT
MESH MARLEX PLUG MEDIUM (Mesh General) ×2 IMPLANT
NEEDLE HYPO 22GX1.5 SAFETY (NEEDLE) IMPLANT
NEEDLE HYPO 25X1 1.5 SAFETY (NEEDLE) ×2 IMPLANT
NS IRRIG 1000ML POUR BTL (IV SOLUTION) ×2 IMPLANT
PACK BASIN DAY SURGERY FS (CUSTOM PROCEDURE TRAY) ×2 IMPLANT
PAIN PUMP ON-Q 100MLX2ML 2.5IN (PAIN MANAGEMENT) IMPLANT
PENCIL BUTTON HOLSTER BLD 10FT (ELECTRODE) ×2 IMPLANT
SLEEVE SCD COMPRESS KNEE MED (MISCELLANEOUS) ×2 IMPLANT
SPONGE LAP 18X18 X RAY DECT (DISPOSABLE) ×2 IMPLANT
STRIP CLOSURE SKIN 1/2X4 (GAUZE/BANDAGES/DRESSINGS) IMPLANT
SUT MNCRL AB 4-0 PS2 18 (SUTURE) ×2 IMPLANT
SUT MON AB 4-0 PC3 18 (SUTURE) ×2 IMPLANT
SUT PROLENE 2 0 SH DA (SUTURE) ×4 IMPLANT
SUT SILK 2 0 SH (SUTURE) ×2 IMPLANT
SUT SILK 3 0 SH 30 (SUTURE) IMPLANT
SUT SILK 3 0 TIES 17X18 (SUTURE) ×1
SUT SILK 3-0 18XBRD TIE BLK (SUTURE) ×1 IMPLANT
SUT VIC AB 0 SH 27 (SUTURE) IMPLANT
SUT VIC AB 2-0 SH 27 (SUTURE) ×1
SUT VIC AB 2-0 SH 27XBRD (SUTURE) ×1 IMPLANT
SUT VIC AB 3-0 SH 27 (SUTURE) ×1
SUT VIC AB 3-0 SH 27X BRD (SUTURE) ×1 IMPLANT
SYR CONTROL 10ML LL (SYRINGE) ×2 IMPLANT
TOWEL OR 17X24 6PK STRL BLUE (TOWEL DISPOSABLE) ×4 IMPLANT
TOWEL OR NON WOVEN STRL DISP B (DISPOSABLE) ×2 IMPLANT
WATER STERILE IRR 1000ML POUR (IV SOLUTION) ×2 IMPLANT

## 2012-06-13 NOTE — Interval H&P Note (Signed)
History and Physical Interval Note:  06/13/2012 8:12 AM  Jeff Moore  has presented today for surgery, with the diagnosis of lEFT FEMORAL HERNIA  The various methods of treatment have been discussed with the patient and family. After consideration of risks, benefits and other options for treatment, the patient has consented to  Procedure(s) (LRB) with comments: HERNIA REPAIR FEMORAL (Left) - Left femoral hernia repair with mesh INSERTION OF MESH (Left) as a surgical intervention .  The patient's history has been reviewed, patient examined, no change in status, stable for surgery.  I have reviewed the patient's chart and labs.  Questions were answered to the patient's satisfaction.     [name],[name]

## 2012-06-13 NOTE — Anesthesia Postprocedure Evaluation (Signed)
  Anesthesia Post-op Note  Patient: Jeff Moore  Procedure(s) Performed: Procedure(s) (LRB) with comments: HERNIA REPAIR FEMORAL (Left) - Left femoral hernia repair with mesh INSERTION OF MESH (Left)  Patient Location: PACU  Anesthesia Type: GA combined with regional for post-op pain  Level of Consciousness: awake, alert  and oriented  Airway and Oxygen Therapy: Patient Spontanous Breathing and Patient connected to face mask oxygen  Post-op Pain: mild  Post-op Assessment: Post-op Vital signs reviewed  Post-op Vital Signs: Reviewed  Complications: No apparent anesthesia complications

## 2012-06-13 NOTE — Op Note (Signed)
06/13/2012  9:58 AM  PATIENT:  Jeff Moore  56 y.o. male  PRE-OPERATIVE DIAGNOSIS:  lEFT FEMORAL HERNIA  POST-OPERATIVE DIAGNOSIS:  lEFT FEMORAL HERNIA  PROCEDURE:  Procedure(s) (LRB) with comments: HERNIA REPAIR FEMORAL (Left) - Left femoral hernia repair with mesh INSERTION OF MESH (Left)  SURGEON:  Surgeon(s) and Role:    * Robyne Askew, MD - Primary  PHYSICIAN ASSISTANT:   ASSISTANTS: none   ANESTHESIA:   general  EBL:  Total I/O In: 200 [I.V.:200] Out: -   BLOOD ADMINISTERED:none  DRAINS: none   LOCAL MEDICATIONS USED:  MARCAINE     SPECIMEN:  No Specimen  DISPOSITION OF SPECIMEN:  N/A  COUNTS:  YES  TOURNIQUET:  * No tourniquets in log *  DICTATION: .Dragon Dictation After informed consent was obtained the patient was brought to the operating room placed in the supine position on the operating table. After adequate induction of general anesthesia the patient's left groin and abdomen were prepped with ChloraPrep, wet-to-dry, and draped in usual sterile manner. The left groin was then infiltrated with quarter percent Marcaine. A small incision was made from the edge of the pubic tubercle on the left towards the anterior superior iliac spine. This incision was carried through the skin and subcutaneous tissue sharply with electrocautery until the fascia of the external oblique was encountered. 2 small bridging veins were clamped with hemostats divided and ligated with 3-0 silk ties. The dissection was carried over the external oblique to the shelving edge of the inguinal ligament. At this location medial to the femoral artery and vein a mass was identified. The sac was opened and the mass consisted of some incarcerated fat but no bowel contents were identified with the hernia. The hernia was dissected circumferentially by blunt hemostat dissection. The opening beneath the inguinal ligament was opened bluntly with a hemostat. The hernia was then easily reduced. The  area was then probed bluntly with a finger and no other abnormalities were noted. A small mesh plug was chosen. The mesh plug was inserted through the opening beneath the inguinal ligament and then the layers of the mesh were opened and fanned out. The mesh was sewn superiorly to the shelving edge and then we'll ligament with an interrupted 2-0 Prolene stitch. Medially and inferiorly the mesh was also sewed to the reflection of the ligament onto the pubic bones. Once this was accomplished the mesh appeared to be in good position. The hernia appeared to be well repaired. The wound was then closed with a deep layer of interrupted 2-0 Vicryl stitches. The skin was then closed with a running 4 Monocryl subcuticular stitch. Dermabond dressings were applied. The patient tolerated the procedure well. At the end of the case the needle sponge and instrument counts were correct. The patient was then awakened taken to recovery in stable condition.  PLAN OF CARE: Discharge to home after PACU  PATIENT DISPOSITION:  PACU - hemodynamically stable.   Delay start of Pharmacological VTE agent (>24hrs) due to surgical blood loss or risk of bleeding: not applicable

## 2012-06-13 NOTE — H&P (View-Only) (Signed)
Subjective:     Patient ID: Jeff Moore, male   DOB: 09/26/1955, 56 y.o.   MRN: [id number]  HPI We're asked to see the patient in consultation by Dr. John to evaluate him for a left inguinal hernia. The patient is a 56-year-old white male who got out of bed last Saturday and felt some pain in the left groin when he got up. The pain has been persistent. He denies any nausea or vomiting. He denies any fevers or chills. He has had some issues with constipation. The pain was bad enough that he went to the emergency department where they obtained a CT scan. The CT scan showed a femoral hernia on the left. There were some fat in the hernia but no involvement of intestines. The patient does have a history of a right inguinal hernia done in the 1980s.  Review of Systems  Constitutional: Negative.   HENT: Negative.   Eyes: Negative.   Respiratory: Negative.   Cardiovascular: Negative.   Gastrointestinal: Positive for abdominal pain. Negative for abdominal distention.  Genitourinary: Negative.   Musculoskeletal: Negative.   Skin: Negative.   Neurological: Negative.   Hematological: Negative.   Psychiatric/Behavioral: Negative.        Objective:   Physical Exam  Constitutional: He is oriented to person, place, and time. He appears well-developed and well-nourished.  HENT:  Head: Normocephalic and atraumatic.  Eyes: Conjunctivae normal and EOM are normal. Pupils are equal, round, and reactive to light.  Neck: Normal range of motion. Neck supple.  Cardiovascular: Normal rate, regular rhythm and normal heart sounds.   Pulmonary/Chest: Effort normal and breath sounds normal.  Abdominal: Soft. Bowel sounds are normal.  Genitourinary:       The patient has a palpable bulge just below the left inguinal ligament medial to the femoral artery that is tender and does not reduce. There is no cellulitis. There is no abdominal distention.  Musculoskeletal: Normal range of motion.  Neurological: He is  alert and oriented to person, place, and time.  Skin: Skin is warm and dry.  Psychiatric: He has a normal mood and affect. His behavior is normal.       Assessment:     The patient appears to have an incarcerated left femoral hernia involving just some fat    Plan:     Because of the risk of incarceration strangulation or think he would benefit from having this hernia fixed. I have discussed with him in detail the risks and benefits of the operation to fix the hernia as well as some of the technical aspects including use of mesh and he understands and wishes to proceed. We will plan to do this form in the near future.      

## 2012-06-13 NOTE — Anesthesia Preprocedure Evaluation (Addendum)
Anesthesia Evaluation  Patient identified by MRN, date of birth, ID band Patient awake    Reviewed: Allergy & Precautions, H&P , NPO status , Patient's Chart, lab work & pertinent test results  Airway Mallampati: I TM Distance: >3 FB Neck ROM: Full    Dental   Pulmonary COPDCurrent Smoker,    Pulmonary exam normal       Cardiovascular Rhythm:Regular Rate:Normal     Neuro/Psych Anxiety Depression    GI/Hepatic (+)     substance abuse  alcohol use,   Endo/Other    Renal/GU      Musculoskeletal   Abdominal   Peds  Hematology   Anesthesia Other Findings   Reproductive/Obstetrics                           Anesthesia Physical Anesthesia Plan  ASA: II  Anesthesia Plan: General   Post-op Pain Management:    Induction: Intravenous  Airway Management Planned: Oral ETT  Additional Equipment:   Intra-op Plan:   Post-operative Plan: Extubation in OR  Informed Consent: I have reviewed the patients History and Physical, chart, labs and discussed the procedure including the risks, benefits and alternatives for the proposed anesthesia with the patient or authorized representative who has indicated his/her understanding and acceptance.     Plan Discussed with: CRNA and Surgeon  Anesthesia Plan Comments:         Anesthesia Quick Evaluation

## 2012-06-13 NOTE — Progress Notes (Signed)
Assisted Dr. Kasik with left, ultrasound guided, transabdominal plane block. Side rails up, monitors on throughout procedure. See vital signs in flow sheet. Tolerated Procedure well. 

## 2012-06-13 NOTE — Transfer of Care (Signed)
Immediate Anesthesia Transfer of Care Note  Patient: Jeff Moore  Procedure(s) Performed: Procedure(s) (LRB) with comments: HERNIA REPAIR FEMORAL (Left) - Left femoral hernia repair with mesh INSERTION OF MESH (Left)  Patient Location: PACU  Anesthesia Type: GA combined with regional for post-op pain  Level of Consciousness: sedated  Airway & Oxygen Therapy: Patient Spontanous Breathing and Patient connected to face mask oxygen  Post-op Assessment: Report given to PACU RN and Post -op Vital signs reviewed and stable  Post vital signs: Reviewed and stable  Complications: No apparent anesthesia complications

## 2012-06-13 NOTE — Anesthesia Procedure Notes (Addendum)
Anesthesia Regional Block:  TAP block  Pre-Anesthetic Checklist: ,, timeout performed, Correct Patient, Correct Site, Correct Laterality, Correct Procedure, Correct Position, site marked, Risks and benefits discussed,  Surgical consent,  Pre-op evaluation,  At surgeon's request and post-op pain management  Laterality: Left  Prep: chloraprep       Needles:  Injection technique: Single-shot  Needle Type: Echogenic Stimulator Needle     Needle Length: 5cm 5 cm Needle Gauge: 22 and 22 G    Additional Needles: TAP block Narrative:  Start time: 06/13/2012 8:24 AM End time: 06/13/2012 8:36 AM Injection made incrementally with aspirations every 3 mL. Anesthesiologist: Dr Gypsy Balsam  Additional Notes: 1610-9604 L TAP Block POP CHG prep, sterile tech US guided-pix in chart         Procedure Name: Intubation Date/Time: 06/13/2012 8:53 AM Performed by: Burna Cash Pre-anesthesia Checklist: Patient identified, Emergency Drugs available, Suction available and Patient being monitored Patient Re-evaluated:Patient Re-evaluated prior to inductionOxygen Delivery Method: Circle System Utilized Preoxygenation: Pre-oxygenation with 100% oxygen Intubation Type: IV induction Ventilation: Mask ventilation without difficulty Laryngoscope Size: Mac and 3 Grade View: Grade I Tube type: Oral Tube size: 8.0 mm Number of attempts: 1 Airway Equipment and Method: stylet and oral airway Placement Confirmation: ETT inserted through vocal cords under direct vision,  positive ETCO2 and breath sounds checked- equal and bilateral Tube secured with: Tape Dental Injury: Teeth and Oropharynx as per pre-operative assessment

## 2012-06-14 ENCOUNTER — Encounter (HOSPITAL_BASED_OUTPATIENT_CLINIC_OR_DEPARTMENT_OTHER): Payer: Self-pay | Admitting: General Surgery

## 2012-06-21 ENCOUNTER — Encounter (INDEPENDENT_AMBULATORY_CARE_PROVIDER_SITE_OTHER): Payer: Self-pay | Admitting: General Surgery

## 2012-07-05 ENCOUNTER — Ambulatory Visit (INDEPENDENT_AMBULATORY_CARE_PROVIDER_SITE_OTHER): Payer: 59 | Admitting: General Surgery

## 2012-07-05 ENCOUNTER — Encounter (INDEPENDENT_AMBULATORY_CARE_PROVIDER_SITE_OTHER): Payer: Self-pay | Admitting: General Surgery

## 2012-07-05 VITALS — BP 134/62 | HR 92 | Temp 98.1°F | Resp 20 | Ht 71.0 in | Wt 132.6 lb

## 2012-07-05 DIAGNOSIS — K4190 Unilateral femoral hernia, without obstruction or gangrene, not specified as recurrent: Secondary | ICD-10-CM

## 2012-07-05 NOTE — Patient Instructions (Signed)
No heavy lifting for another 3 weeks 

## 2012-07-05 NOTE — Progress Notes (Signed)
Subjective:     Patient ID: Jeff Moore, male   DOB: [birth year], 56 y.o.   MRN: [id number]  HPI The patient is a 56 year old white male who is about 3 weeks status post left femoral hernia repair with mesh. He notes a little bit of soreness but feels much better than he did prior to surgery. His appetite is good his bowels are working normally. He is back at work doing Oceanographer.  Review of Systems     Objective:   Physical Exam On exam his abdomen is soft and nontender. His left groin incision is healing nicely with no sign of infection. There is no palpable evidence for recurrence of the hernia.    Assessment:     3 weeks status post left femoral hernia repair with mesh    Plan:     At this point I would like him to continue to refrain from heavy lifting for another 3 weeks. We will plan to see him back at the end of that time to check his progress

## 2012-08-02 ENCOUNTER — Ambulatory Visit (INDEPENDENT_AMBULATORY_CARE_PROVIDER_SITE_OTHER): Payer: 59 | Admitting: General Surgery

## 2012-08-02 ENCOUNTER — Encounter (INDEPENDENT_AMBULATORY_CARE_PROVIDER_SITE_OTHER): Payer: Self-pay | Admitting: General Surgery

## 2012-08-02 VITALS — BP 126/74 | HR 86 | Temp 97.6°F | Resp 18 | Ht 71.0 in | Wt 135.8 lb

## 2012-08-02 DIAGNOSIS — K4190 Unilateral femoral hernia, without obstruction or gangrene, not specified as recurrent: Secondary | ICD-10-CM

## 2012-08-02 NOTE — Patient Instructions (Signed)
May return to all normal activities 

## 2012-08-02 NOTE — Progress Notes (Signed)
Subjective:     Patient ID: Jeff Moore, male   DOB: 05-07-1956, 56 y.o.   MRN: [id number]  HPI The patient is a 56 year old white male who is 6 weeks status post left femoral hernia repair with mesh. He notes only some very minor soreness in the area which is continuing to improve. His appetite is not good right now because of an abscessed tooth but his bowels are working normally.  Review of Systems     Objective:   Physical Exam On exam his abdomen is soft and nontender. His left groin incision is healing nicely with no sign of infection. His abdominal wall feels very solid with no palpable evidence of recurrence of the hernia    Assessment:     6 weeks status post left femoral hernia repair with mesh    Plan:     At this point he may return of his normal activities without any restrictions. We will plan to see him back on a when necessary basis.

## 2012-10-27 ENCOUNTER — Other Ambulatory Visit: Payer: Self-pay | Admitting: Internal Medicine

## 2013-09-03 IMAGING — CT CT ABD-PELV W/ CM
1 of 3 series · 14 of 32 positions shown, 19 images · IV contrast (OMNIPAQUE 300)
Comparison: None.

CLINICAL DATA: Left inguinal mass, pain.

CT ABDOMEN AND PELVIS WITH CONTRAST
TECHNIQUE: Multidetector CT imaging of the abdomen and pelvis was
performed following the standard protocol during bolus
administration of intravenous contrast.
Contrast: 100mL OMNIPAQUE IOHEXOL 300 MG/ML  SOLN

[Series 2: abd/pel with · axial · 0.74mm/px · z∈[-752,-322]mm · 14 of 98 slices shown, 19 images]
[im 6/98  soft-tissue]
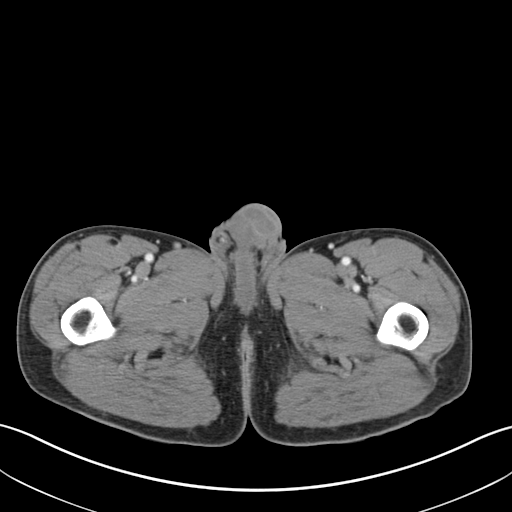
[im 6/98  bone]
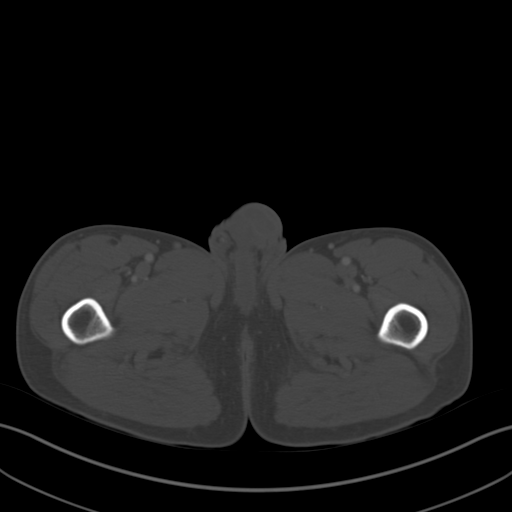
[im 16/98  soft-tissue]
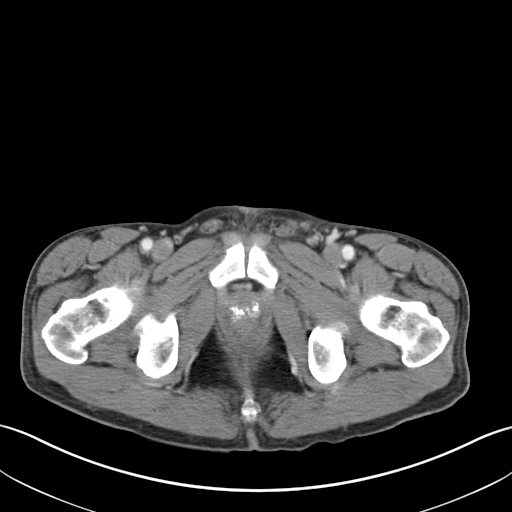
[im 21/98  soft-tissue]
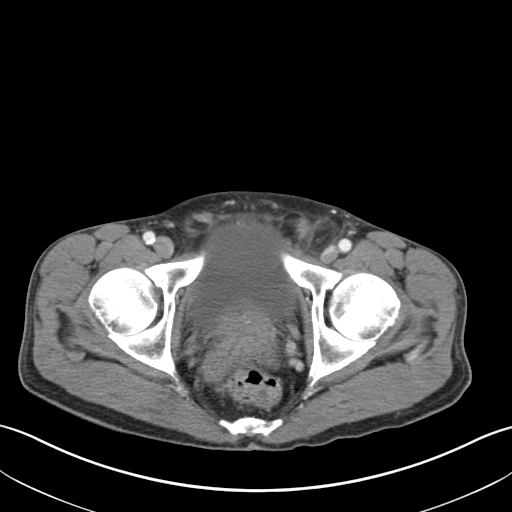
[im 26/98  soft-tissue]
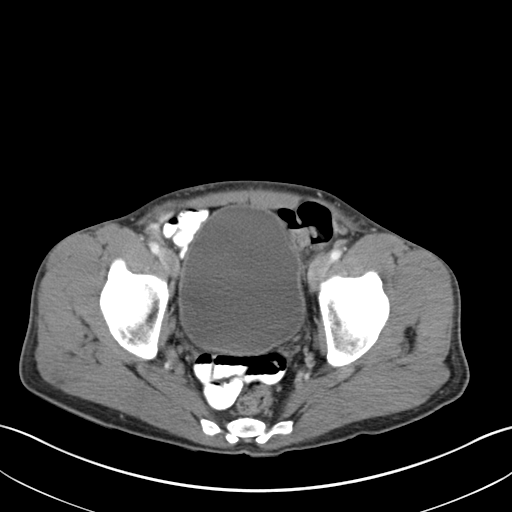
[im 36/98  soft-tissue]
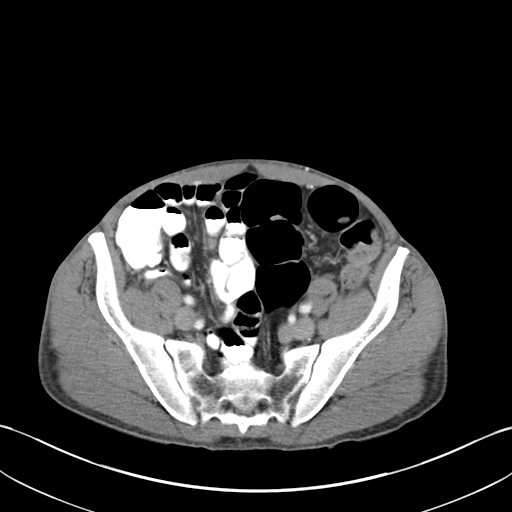
[im 41/98  soft-tissue]
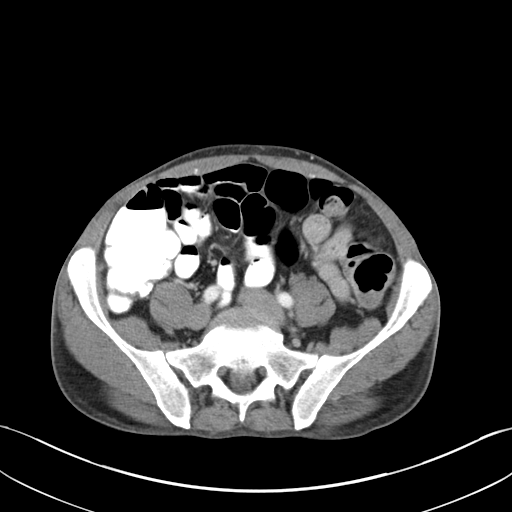
[im 52/98  soft-tissue]
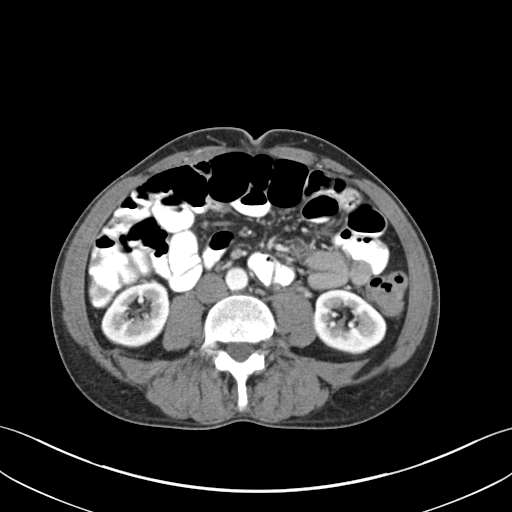
[im 57/98  soft-tissue]
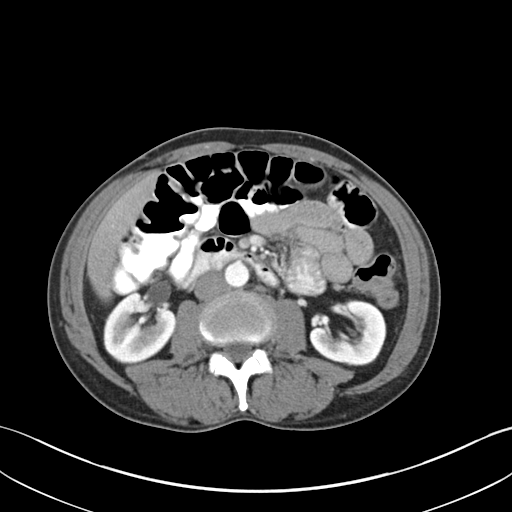
[im 62/98  soft-tissue]
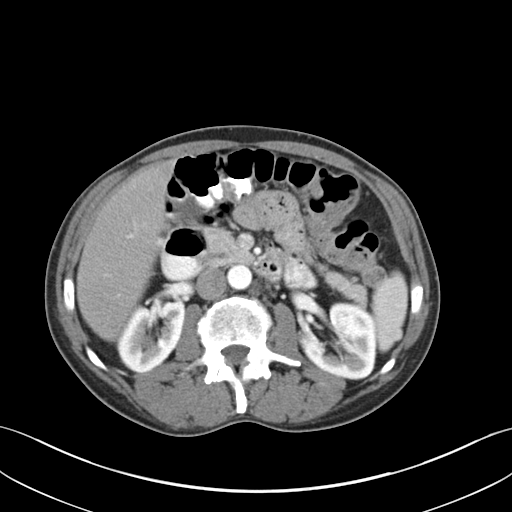
[im 62/98  bone]
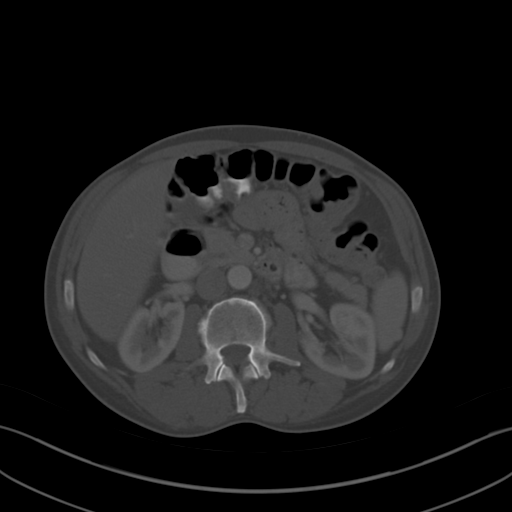
[im 72/98  soft-tissue]
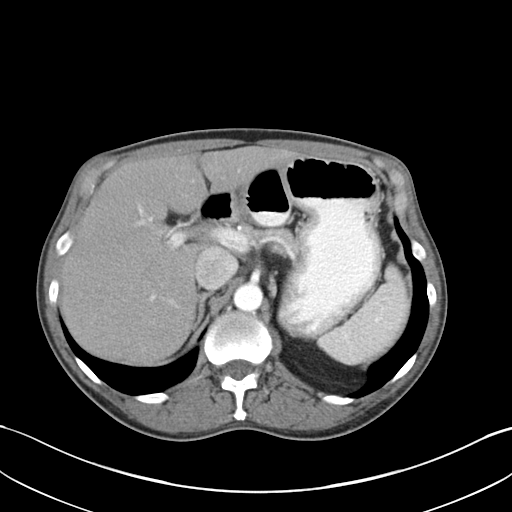
[im 77/98  soft-tissue]
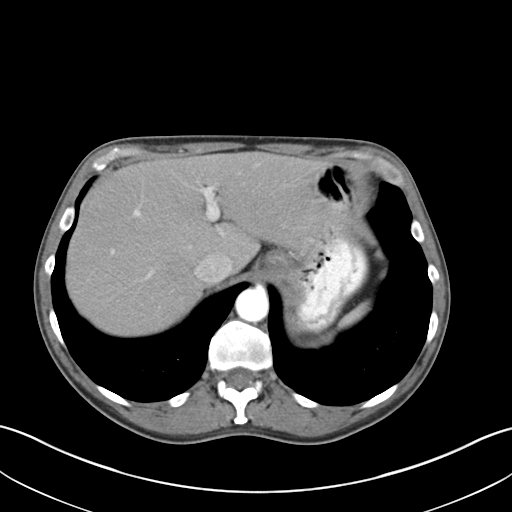
[im 77/98  lung]
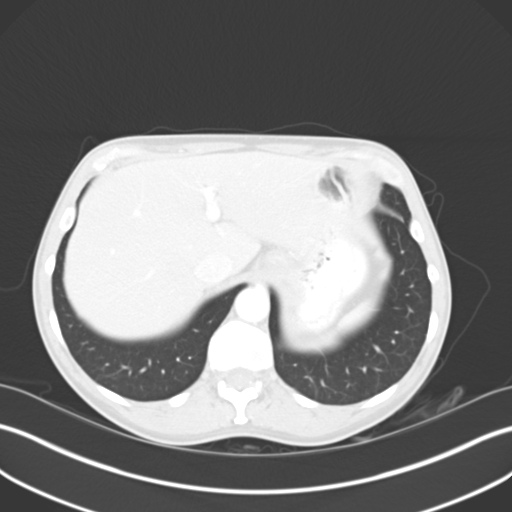
[im 82/98  soft-tissue]
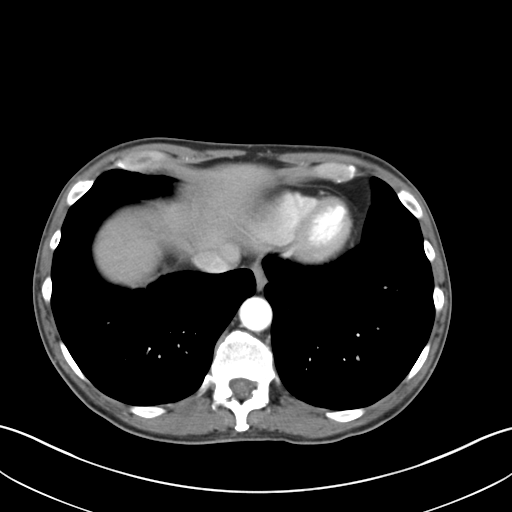
[im 82/98  lung]
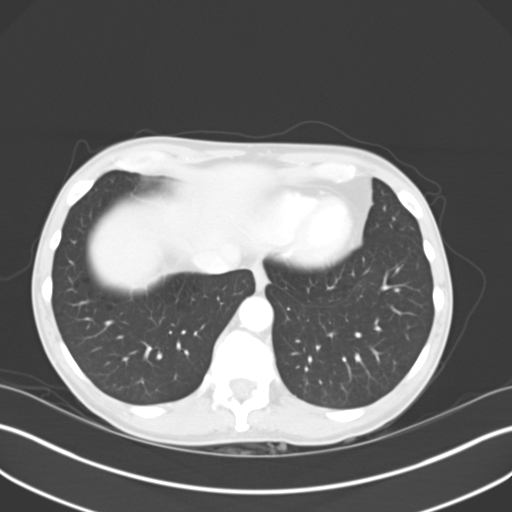
[im 87/98  lung]
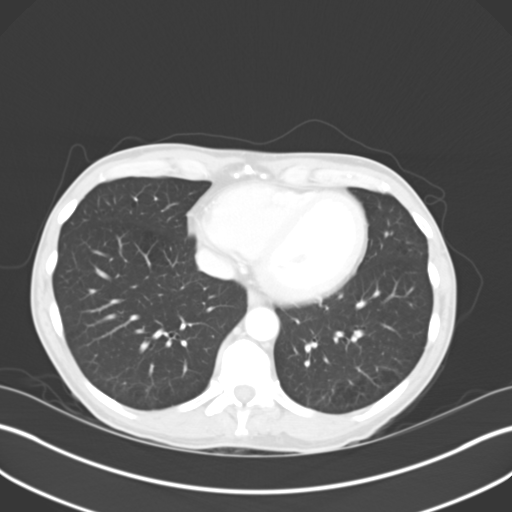
[im 92/98  soft-tissue]
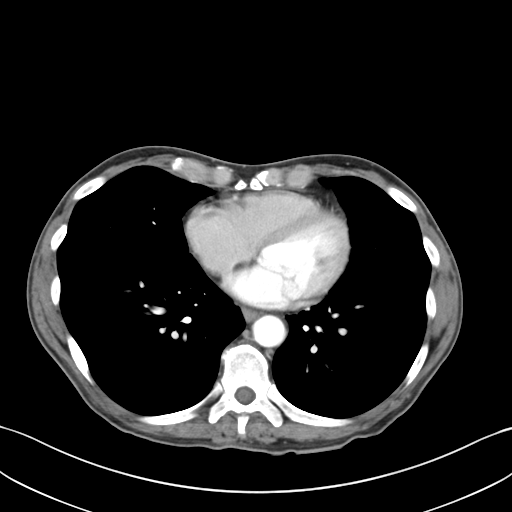
[im 92/98  lung]
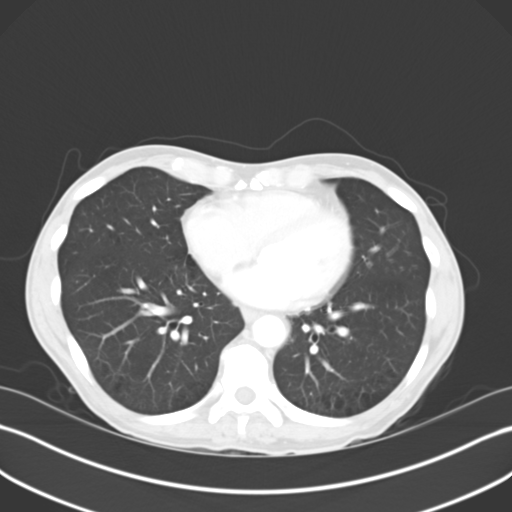

[14 of 32 positions shown; findings below may reference images not displayed]

FINDINGS: Mild centrolobular emphysematous changes at the lung
bases.  Heart size within normal limits.  No pleural or pericardial
effusion.

Low attenuation of the liver is nonspecific post contrast however
may reflect fatty infiltration.  Unremarkable biliary system,
spleen, adrenal glands. There is mild prominence of the main
pancreatic duct within the head, measuring 4 mm.  No obstructing
lesion is identified. No infiltrative pancreatic lesion visualized.

Symmetric renal enhancement.  No hydronephrosis or hydroureter.

No bowel obstruction no CT evidence for colitis.  Appendix not
confidently identified. No right lower quadrant inflammation.  No
free intraperitoneal air or fluid.  No lymphadenopathy.

Normal caliber vasculature.

Thin-walled bladder.  Nonspecific prostatic calcifications.  Fat
stranding within the left groin just medial to the left femoral
vein, suspicious for incarcerated femoral hernia.  No bowel within
the hernia.

Mild lumbar degenerative changes.  No acute osseous finding.
IMPRESSION: Fat stranding within the left groin just medial to the left femoral
vein is most in keeping with an incarcerated left femoral hernia.

Mild prominence of the main pancreatic duct within the head,
measuring 4 mm.  No obstructing or infiltrative lesion is
identified. Recommend comparison with prior imaging if available
and consider pancreas MR follow-up.

## 2015-02-24 ENCOUNTER — Ambulatory Visit (INDEPENDENT_AMBULATORY_CARE_PROVIDER_SITE_OTHER): Payer: 59 | Admitting: Internal Medicine

## 2015-02-24 ENCOUNTER — Encounter: Payer: Self-pay | Admitting: Internal Medicine

## 2015-02-24 VITALS — BP 124/80 | HR 99 | Temp 98.3°F | Ht 71.0 in | Wt 128.0 lb

## 2015-02-24 DIAGNOSIS — W5501XA Bitten by cat, initial encounter: Secondary | ICD-10-CM | POA: Diagnosis not present

## 2015-02-24 DIAGNOSIS — S61459A Open bite of unspecified hand, initial encounter: Secondary | ICD-10-CM | POA: Diagnosis not present

## 2015-02-24 MED ORDER — OXYCODONE-ACETAMINOPHEN 5-325 MG PO TABS: 1.0000 | ORAL_TABLET | Freq: Three times a day (TID) | ORAL | Status: DC | PRN

## 2015-02-24 MED ORDER — AMOXICILLIN-POT CLAVULANATE 875-125 MG PO TABS: 1.0000 | ORAL_TABLET | Freq: Two times a day (BID) | ORAL | Status: DC

## 2015-02-24 MED ORDER — CEFTRIAXONE SODIUM 1 G IJ SOLR
1.0000 g | Freq: Once | INTRAMUSCULAR | Status: AC
  Administered 2015-02-24: 1 g via INTRAMUSCULAR

## 2015-02-24 MED ORDER — OXYCODONE-ACETAMINOPHEN 5-325 MG PO TABS: 1.0000 | ORAL_TABLET | Freq: Four times a day (QID) | ORAL | Status: DC | PRN

## 2015-02-24 NOTE — Progress Notes (Signed)
   Subjective:    Patient ID: Jeff ChannelKenneth E Sirois, male    DOB: 26-Jul-1956, 59 y.o.   MRN: [id number]  HPI  Here today after trying to break up a fight between his cat and a pit bull walking by last night;  Suffered mult cat bite punctures to both hands, now with rather mod to severe pain/red/swelling already to the entire left thumb and thenar eminence without drainage or fluctuance, but also to the area of right hand first MCP with erythema tracking linearly visibly to the right upper arm (red streak), again without drainage but also somewhat less painful and swollen on the right. No fever.  Read on the internet where cat bites can be severe Past Medical History  Diagnosis Date  . ALCOHOL ABUSE 06/04/2010  . DEPRESSION 05/17/2010  . COLONIC POLYPS, HX OF 05/17/2010  . Rash 06/07/2012    forehead  . Dental crowns present   . History of MRSA infection 2003  . Femoral hernia 05/2012    left   Past Surgical History  Procedure Laterality Date  . Stress test negative for ischemia  Jan. 2005  . Inguinal hernia repair      right  . Femoral hernia repair  06/13/2012    Procedure: HERNIA REPAIR FEMORAL;  Surgeon: Robyne AskewPaul S [name], MD;  Location: [facility];  Service: General;  Laterality: Left;  Left femoral hernia repair with mesh    reports that he has been smoking Cigarettes.  He has a 20 pack-year smoking history. He has never used smokeless tobacco. He reports that he drinks alcohol. He reports that he does not use illicit drugs. family history includes Cancer in his mother; Heart disease in his father. No Known Allergies Current Outpatient Prescriptions on File Prior to Visit  Medication Sig Dispense Refill  . amoxicillin (AMOXIL) 500 MG capsule Take 500 mg by mouth 3 (three) times daily.    Marland Kitchen. escitalopram (LEXAPRO) 20 MG tablet TAKE 0.5 TABLETS BY MOUTH DAILY (Patient not taking: Reported on 02/24/2015) 45 tablet 1   No current facility-administered medications on file prior to  visit.    Review of Systems All otherwise neg per pt     Objective:   Physical Exam BP 124/80 mmHg  Pulse 99  Temp(Src) 98.3 F (36.8 C) (Oral)  Ht 5\' 11"  (1.803 m)  Wt 128 lb (58.06 kg)  BMI 17.86 kg/m2  SpO2 98% VS noted, not ill appearing Constitutional: Pt appears in no significant distress HENT: Head: NCAT.  Right Ear: External ear normal.  Left Ear: External ear normal.  Eyes: . Pupils are equal, round, and reactive to light. Conjunctivae and EOM are normal Neck: Normal range of motion. Neck supple.  Cardiovascular: Normal rate and regular rhythm.   Pulmonary/Chest: Effort normal and breath sounds without rales or wheezing.  Right hand/arm with 1-2+ red/tender/swelling primariily at the puncture wound sites at the post hand/first MCP area, with red streak very dramatic to the right upper arm Left hand with 2-3+ sweling/red/tender/heat to entire thumb, worst at the punture sites as the left thenar eminence, no red streaks, no drainage or fluctuance Neurological: Pt is alert. Not confused , motor grossly intact Skin: Skin is warm. No rash, no LE edema Psychiatric: Pt behavior is normal. No agitation.     Assessment & Plan:

## 2015-02-24 NOTE — Patient Instructions (Signed)
You had the antibiotic shot today  Please take all new medication as prescribed - the antibiotic, and pain medication  Please continue all other medications as before  Please have the pharmacy call with any other refills you may need  Please keep your appointments with your specialists as you may have planned

## 2015-02-24 NOTE — Assessment & Plan Note (Signed)
Has left > right bilat cat bite cellulitis with rapid/dramatic onset - for rocephin IM today, then augmentin bid,  to f/u any worsening symptoms or concerns

## 2015-02-24 NOTE — Progress Notes (Signed)
Pre visit review using our clinic review tool, if applicable. No additional management support is needed unless otherwise documented below in the visit note. 

## 2015-02-24 NOTE — Addendum Note (Signed)
Addended by: Anselm JunglingBONYUN-DEBOURGH, Cardell Rachel M on: 02/24/2015 06:28 PM   Modules accepted: Orders

## 2015-02-26 ENCOUNTER — Ambulatory Visit: Payer: Self-pay

## 2015-02-26 ENCOUNTER — Ambulatory Visit (INDEPENDENT_AMBULATORY_CARE_PROVIDER_SITE_OTHER): Payer: 59 | Admitting: Podiatry

## 2015-02-26 ENCOUNTER — Encounter: Payer: Self-pay | Admitting: Podiatry

## 2015-02-26 VITALS — BP 132/74 | HR 75 | Resp 15

## 2015-02-26 DIAGNOSIS — M722 Plantar fascial fibromatosis: Secondary | ICD-10-CM

## 2015-02-26 MED ORDER — TRIAMCINOLONE ACETONIDE 10 MG/ML IJ SUSP
10.0000 mg | Freq: Once | INTRAMUSCULAR | Status: AC
  Administered 2015-02-26: 10 mg

## 2015-02-26 NOTE — Patient Instructions (Signed)

## 2015-02-26 NOTE — Progress Notes (Signed)
Subjective:     Patient ID: Jeff Moore, male   DOB: 05-14-1956, 59 y.o.   MRN: [id number]  HPI patient presents with severe pain right plantar heel at the insertional point tendon the calcaneus with mild on the left and states she's tried oral anti-inflammatory and over-the-counter insoles without relief of symptoms. States it's been present for about 2 months   Review of Systems  All other systems reviewed and are negative.      Objective:   Physical Exam  Constitutional: He is oriented to person, place, and time.  Cardiovascular: Intact distal pulses.   Musculoskeletal: Normal range of motion.  Neurological: He is oriented to person, place, and time.  Skin: Skin is warm.  Nursing note and vitals reviewed.  neurovascular status intact muscle strength adequate with range of motion subtalar midtarsal joint within normal limits. Patient's found a very narrow heel and is found to have moderate depression of the arch with severe discomfort in the medial band right fascia at the insertional point tendon into the calcaneus with fluid buildup and mild changes on the left     Assessment:     Acute plantar fasciitis right over left with inflammation    Plan:     H&P and condition reviewed with patient. I injected the right plantar fascia 3 Milligan Kenalog 5 mill grams Xylocaine and applied fascial brace with instructions on usage and placed on mobile big 15 mg daily and gave instructions on physical therapy. Reappoint to recheck

## 2015-02-26 NOTE — Progress Notes (Signed)
   Subjective:    Patient ID: Jeff ChannelKenneth E Philipps, male    DOB: 12-31-1955, 59 y.o.   MRN: [id number]  HPI P tpresents with right foot pain in heel, lasting 1 month now, worsens with prolonged walking. Has tried otc orthotics, nsaids   Review of Systems  All other systems reviewed and are negative.      Objective:   Physical Exam        Assessment & Plan:

## 2015-02-27 ENCOUNTER — Telehealth: Payer: Self-pay | Admitting: *Deleted

## 2015-02-27 MED ORDER — MELOXICAM 15 MG PO TABS: 15.0000 mg | ORAL_TABLET | Freq: Every day | ORAL | Status: DC

## 2015-02-27 NOTE — Telephone Encounter (Signed)
Ms. Sande BrothersWinters called states pt was seen yesterday by Dr. Charlsie Merlesegal and was to get an antiinflammatory.  I reviewed pt's 02/26/2015 chart notes and he was to get Mobic 15mg  #30 one every day 0 refills, per Dr. Charlsie Merlesegal.

## 2015-03-05 ENCOUNTER — Encounter: Payer: Self-pay | Admitting: Podiatry

## 2015-03-05 ENCOUNTER — Ambulatory Visit (INDEPENDENT_AMBULATORY_CARE_PROVIDER_SITE_OTHER): Payer: 59 | Admitting: Podiatry

## 2015-03-05 VITALS — BP 119/80 | HR 82 | Resp 12

## 2015-03-05 DIAGNOSIS — M722 Plantar fascial fibromatosis: Secondary | ICD-10-CM | POA: Diagnosis not present

## 2015-03-05 NOTE — Patient Instructions (Signed)

## 2015-03-05 NOTE — Progress Notes (Signed)
Subjective:     Patient ID: Jeff Moore, male   DOB: 06-25-1956, 59 y.o.   MRN: [id number]  HPI patient states my right heel is doing some better than it was previously but it still sore and I know I don't have any fat pad   Review of Systems     Objective:   Physical Exam Neurovascular status intact with inflammation and pain with the palpation of the calcaneus right and the plantar heel region right    Assessment:     Continuation of plantar fasciitis right with atrophied fat pad is a part of the problem    Plan:     Reviewed condition and treatment options and I recommended a cushioned type orthotic to reduce stress against the plantar heels and arch. Patient is scanned for custom orthotics at this time

## 2015-03-27 ENCOUNTER — Ambulatory Visit: Payer: 59 | Admitting: *Deleted

## 2015-03-27 DIAGNOSIS — M722 Plantar fascial fibromatosis: Secondary | ICD-10-CM

## 2015-03-27 NOTE — Progress Notes (Signed)
Patient ID: Jeff Moore, male   DOB: [birth year], 59 y.o.   MRN: [id number] Patient presents for orthotic pick up.  Verbal and written break in and wear instructions given.  Patient will follow up in 4 weeks if symptoms worsen or fail to improve.

## 2015-03-27 NOTE — Patient Instructions (Signed)

## 2015-08-26 ENCOUNTER — Telehealth: Payer: Self-pay | Admitting: Emergency Medicine

## 2015-08-26 ENCOUNTER — Ambulatory Visit (INDEPENDENT_AMBULATORY_CARE_PROVIDER_SITE_OTHER): Payer: 59 | Admitting: Internal Medicine

## 2015-08-26 ENCOUNTER — Other Ambulatory Visit (INDEPENDENT_AMBULATORY_CARE_PROVIDER_SITE_OTHER): Payer: 59

## 2015-08-26 ENCOUNTER — Encounter: Payer: Self-pay | Admitting: Internal Medicine

## 2015-08-26 VITALS — BP 126/78 | HR 92 | Temp 98.5°F | Ht 71.0 in | Wt 122.0 lb

## 2015-08-26 DIAGNOSIS — F1010 Alcohol abuse, uncomplicated: Secondary | ICD-10-CM

## 2015-08-26 DIAGNOSIS — Z86010 Personal history of colonic polyps: Secondary | ICD-10-CM | POA: Diagnosis not present

## 2015-08-26 DIAGNOSIS — Z0000 Encounter for general adult medical examination without abnormal findings: Secondary | ICD-10-CM | POA: Diagnosis not present

## 2015-08-26 DIAGNOSIS — F32A Depression, unspecified: Secondary | ICD-10-CM

## 2015-08-26 DIAGNOSIS — F329 Major depressive disorder, single episode, unspecified: Secondary | ICD-10-CM

## 2015-08-26 LAB — LIPID PANEL
CHOLESTEROL: 147 mg/dL (ref 0–200)
HDL: 58.3 mg/dL (ref >39.00)
LDL Cholesterol: 68 mg/dL (ref 0–99)
NONHDL: 88.43
TRIGLYCERIDES: 104 mg/dL (ref 0.0–149.0)
Total CHOL/HDL Ratio: 3
VLDL: 20.8 mg/dL (ref 0.0–40.0)

## 2015-08-26 LAB — CBC WITH DIFFERENTIAL/PLATELET
Basophils Absolute: 0.1 10*3/uL (ref 0.0–0.1)
Basophils Relative: 0.7 % (ref 0.0–3.0)
EOS ABS: 0.2 10*3/uL (ref 0.0–0.7)
Eosinophils Relative: 2 % (ref 0.0–5.0)
HEMATOCRIT: 45 % (ref 39.0–52.0)
Hemoglobin: 14.8 g/dL (ref 13.0–17.0)
LYMPHS PCT: 17.8 % (ref 12.0–46.0)
Lymphs Abs: 1.8 10*3/uL (ref 0.7–4.0)
MCHC: 33 g/dL (ref 30.0–36.0)
MCV: 95.1 fl (ref 78.0–100.0)
Monocytes Absolute: 0.8 10*3/uL (ref 0.1–1.0)
Monocytes Relative: 7.6 % (ref 3.0–12.0)
NEUTROS ABS: 7.3 10*3/uL (ref 1.4–7.7)
Neutrophils Relative %: 71.9 % (ref 43.0–77.0)
PLATELETS: 234 10*3/uL (ref 150.0–400.0)
RBC: 4.73 Mil/uL (ref 4.22–5.81)
RDW: 13 % (ref 11.5–15.5)
WBC: 10.1 10*3/uL (ref 4.0–10.5)

## 2015-08-26 LAB — HEPATIC FUNCTION PANEL
ALBUMIN: 4.3 g/dL (ref 3.5–5.2)
ALT: 22 U/L (ref 0–53)
AST: 24 U/L (ref 0–37)
Alkaline Phosphatase: 94 U/L (ref 39–117)
BILIRUBIN DIRECT: 0.1 mg/dL (ref 0.0–0.3)
TOTAL PROTEIN: 6.9 g/dL (ref 6.0–8.3)
Total Bilirubin: 0.3 mg/dL (ref 0.2–1.2)

## 2015-08-26 LAB — URINALYSIS, ROUTINE W REFLEX MICROSCOPIC
BILIRUBIN URINE: NEGATIVE
HGB URINE DIPSTICK: NEGATIVE
KETONES UR: NEGATIVE
LEUKOCYTES UA: NEGATIVE
NITRITE: NEGATIVE
RBC / HPF: NONE SEEN (ref 0–?)
Total Protein, Urine: NEGATIVE
URINE GLUCOSE: NEGATIVE
UROBILINOGEN UA: 0.2 (ref 0.0–1.0)
pH: 5.5 (ref 5.0–8.0)

## 2015-08-26 LAB — BASIC METABOLIC PANEL
BUN: 19 mg/dL (ref 6–23)
CALCIUM: 9.9 mg/dL (ref 8.4–10.5)
CHLORIDE: 105 meq/L (ref 96–112)
CO2: 30 meq/L (ref 19–32)
CREATININE: 0.72 mg/dL (ref 0.40–1.50)
GFR: 119.07 mL/min (ref >60.00)
GLUCOSE: 73 mg/dL (ref 70–99)
Potassium: 6.1 mEq/L (ref 3.5–5.1)
Sodium: 142 mEq/L (ref 135–145)

## 2015-08-26 LAB — TSH: TSH: 0.78 u[IU]/mL (ref 0.35–4.50)

## 2015-08-26 LAB — PSA: PSA: 1.58 ng/mL (ref 0.10–4.00)

## 2015-08-26 MED ORDER — PAROXETINE HCL 20 MG PO TABS: 20.0000 mg | ORAL_TABLET | Freq: Every day | ORAL | Status: DC

## 2015-08-26 MED ORDER — ASPIRIN EC 81 MG PO TBEC: 81.0000 mg | DELAYED_RELEASE_TABLET | Freq: Every day | ORAL | Status: AC

## 2015-08-26 NOTE — Telephone Encounter (Signed)
Ok to re-order the repeat K only - hyperkalemia to be done asap

## 2015-08-26 NOTE — Telephone Encounter (Signed)
Left message on machine for pt to return my call  

## 2015-08-26 NOTE — Progress Notes (Signed)
Pre visit review using our clinic review tool, if applicable. No additional management support is needed unless otherwise documented below in the visit note. 

## 2015-08-26 NOTE — Telephone Encounter (Signed)
Spoke with lab, they reported that Pt's potassium levels were at 6.1

## 2015-08-26 NOTE — Progress Notes (Signed)
Subjective:    Patient ID: Jeff Moore, male    DOB: [birth year], 59 y.o.   MRN: [id number]  HPI  Here for wellness and f/u;  Overall doing ok;  Pt denies Chest pain, worsening SOB, DOE, wheezing, orthopnea, PND, worsening LE edema, palpitations, dizziness or syncope.  Pt denies neurological change such as new headache, facial or extremity weakness.  Pt denies polydipsia, polyuria, or low sugar symptoms. Pt states overall good compliance with treatment and medications, good tolerability, and has been trying to follow appropriate diet.  Pt denies worsening depressive symptoms, suicidal ideation or panic. No fever, night sweats, wt loss, loss of appetite, or other constitutional symptoms.  Pt states good ability with ADL's, has low fall risk, home safety reviewed and adequate, no other significant changes in hearing or vision, and only occasionally active with exercise.  Losing wt - not sure why.  Does drink 3-6 beers per night due to boredom, smoke 1/2 ppd, still work 40-50 hrs per wk.  Stopped the lexapro approx 1 yr ago, and looking back thinks maybe he should not have done this.  Now quite irritable and can blow up over small things.  Has lost some wt with not eating well -  Wt Readings from Last 3 Encounters:  08/26/15 122 lb (55.339 kg)  02/24/15 128 lb (58.06 kg)  08/02/12 135 lb 12.8 oz (61.598 kg)   Past Medical History  Diagnosis Date  . ALCOHOL ABUSE 06/04/2010  . DEPRESSION 05/17/2010  . COLONIC POLYPS, HX OF 05/17/2010  . Rash 06/07/2012    forehead  . Dental crowns present   . History of MRSA infection 2003  . Femoral hernia 05/2012    left   Past Surgical History  Procedure Laterality Date  . Stress test negative for ischemia  Jan. 2005  . Inguinal hernia repair      right  . Femoral hernia repair  06/13/2012    Procedure: HERNIA REPAIR FEMORAL;  Surgeon: Robyne AskewPaul S [name], MD;  Location: [facility];  Service: General;  Laterality: Left;  Left femoral hernia  repair with mesh    reports that he has been smoking Cigarettes.  He has a 20 pack-year smoking history. He has never used smokeless tobacco. He reports that he drinks alcohol. He reports that he does not use illicit drugs. family history includes Cancer in his mother; Heart disease in his father. No Known Allergies No current outpatient prescriptions on file prior to visit.   No current facility-administered medications on file prior to visit.     Review of Systems Constitutional: Negative for increased diaphoresis, other activity, appetite or siginficant weight change other than noted HENT: Negative for worsening hearing loss, ear pain, facial swelling, mouth sores and neck stiffness.   Eyes: Negative for other worsening pain, redness or visual disturbance.  Respiratory: Negative for shortness of breath and wheezing  Cardiovascular: Negative for chest pain and palpitations.  Gastrointestinal: Negative for diarrhea, blood in stool, abdominal distention or other pain Genitourinary: Negative for hematuria, flank pain or change in urine volume.  Musculoskeletal: Negative for myalgias or other joint complaints.  Skin: Negative for color change and wound or drainage.  Neurological: Negative for syncope and numbness. other than noted Hematological: Negative for adenopathy. or other swelling Psychiatric/Behavioral: Negative for hallucinations, SI, self-injury, decreased concentration or other worsening agitation.      Objective:   Physical Exam BP 126/78 mmHg  Pulse 92  Temp(Src) 98.5 F (36.9 C) (Oral)  Ht  (1.803 m)  Wt 122 lb (55.339 kg)  BMI 17.02 kg/m2  SpO2 97% VS noted,  Constitutional: Pt is oriented to person, place, and time. Appears thin , in no significant distress Head: Normocephalic and atraumatic.  Right Ear: External ear normal.  Left Ear: External ear normal.  Nose: Nose normal.  Mouth/Throat: Oropharynx is clear and moist.  Eyes: Conjunctivae and EOM are  normal. Pupils are equal, round, and reactive to light.  Neck: Normal range of motion. Neck supple. No JVD present. No tracheal deviation present or significant neck LA or mass Cardiovascular: Normal rate, regular rhythm, normal heart sounds and intact distal pulses.   Pulmonary/Chest: Effort normal and breath sounds without rales or wheezing  Abdominal: Soft. Bowel sounds are normal. NT. No HSM  Musculoskeletal: Normal range of motion. Exhibits no edema.  Lymphadenopathy:  Has no cervical adenopathy.  Neurological: Pt is alert and oriented to person, place, and time. Pt has normal reflexes. No cranial nerve deficit. Motor grossly intact Skin: Skin is warm and dry. No rash noted.  Psychiatric:  Has ndepressed mood and affect. Behavior is normal.        Assessment & Plan:

## 2015-08-26 NOTE — Patient Instructions (Addendum)
Please take all new medication as prescribed - the paxil at 20 mg per day  Please continue all other medications as before, and refills have been done if requested.  Please have the pharmacy call with any other refills you may need.  Please continue your efforts at being more active, low cholesterol diet, and weight control.  You are otherwise up to date with prevention measures today.  Please stop Alcohol and Tobacco use  You will be contacted regarding the referral for: colonoscopy  Please keep your appointments with your specialists as you may have planned  Please go to the LAB in the Basement (turn left off the elevator) for the tests to be done today  You will be contacted by phone if any changes need to be made immediately.  Otherwise, you will receive a letter about your results with an explanation, but please check with MyChart first.  Please remember to sign up for MyChart if you have not done so, as this will be important to you in the future with finding out test results, communicating by private email, and scheduling acute appointments online when needed.  Please return in 3 months, or sooner if needed

## 2015-08-27 ENCOUNTER — Encounter: Payer: Self-pay | Admitting: Internal Medicine

## 2015-08-27 ENCOUNTER — Other Ambulatory Visit: Payer: Self-pay | Admitting: Internal Medicine

## 2015-08-27 DIAGNOSIS — E875 Hyperkalemia: Secondary | ICD-10-CM

## 2015-08-27 NOTE — Assessment & Plan Note (Signed)

## 2015-08-27 NOTE — Assessment & Plan Note (Signed)
Due for colonscopy f/u - will refer

## 2015-08-27 NOTE — Telephone Encounter (Signed)
See result note 08/26/2015

## 2015-08-27 NOTE — Assessment & Plan Note (Signed)
No symptoms of dependence and w/d, but needs to further abstain or at least reduce overall intake,  to f/u any worsening symptoms or concerns

## 2015-08-27 NOTE — Assessment & Plan Note (Signed)
With recent wt loss, had improved with lexapro but stopped approx 1 yr ago, this time for paxil 20 qd which may help maintain wt better

## 2015-08-28 ENCOUNTER — Other Ambulatory Visit (INDEPENDENT_AMBULATORY_CARE_PROVIDER_SITE_OTHER): Payer: 59

## 2015-08-28 ENCOUNTER — Encounter: Payer: Self-pay | Admitting: Internal Medicine

## 2015-08-28 DIAGNOSIS — E875 Hyperkalemia: Secondary | ICD-10-CM | POA: Diagnosis not present

## 2015-08-28 LAB — BASIC METABOLIC PANEL
BUN: 13 mg/dL (ref 6–23)
CALCIUM: 9.3 mg/dL (ref 8.4–10.5)
CHLORIDE: 103 meq/L (ref 96–112)
CO2: 29 meq/L (ref 19–32)
CREATININE: 0.77 mg/dL (ref 0.40–1.50)
GFR: 110.19 mL/min (ref >60.00)
Glucose, Bld: 97 mg/dL (ref 70–99)
Potassium: 4.1 mEq/L (ref 3.5–5.1)
Sodium: 137 mEq/L (ref 135–145)

## 2015-09-23 ENCOUNTER — Encounter: Payer: Self-pay | Admitting: Internal Medicine

## 2015-11-24 ENCOUNTER — Ambulatory Visit: Payer: 59 | Admitting: Internal Medicine

## 2016-10-01 ENCOUNTER — Encounter: Payer: Self-pay | Admitting: Family Medicine

## 2016-10-01 ENCOUNTER — Ambulatory Visit (INDEPENDENT_AMBULATORY_CARE_PROVIDER_SITE_OTHER): Payer: 59 | Admitting: Family Medicine

## 2016-10-01 ENCOUNTER — Other Ambulatory Visit (HOSPITAL_COMMUNITY)
Admission: RE | Admit: 2016-10-01 | Discharge: 2016-10-01 | Disposition: A | Discharge status: Home / Self Care | Payer: 59 | Source: Other Acute Inpatient Hospital | Attending: Family Medicine | Admitting: Family Medicine

## 2016-10-01 VITALS — BP 140/80 | HR 74 | Temp 98.5°F | Resp 20 | Wt 127.5 lb

## 2016-10-01 DIAGNOSIS — R300 Dysuria: Secondary | ICD-10-CM

## 2016-10-01 DIAGNOSIS — N10 Acute tubulo-interstitial nephritis: Secondary | ICD-10-CM | POA: Diagnosis not present

## 2016-10-01 LAB — POCT URINALYSIS DIPSTICK
Bilirubin, UA: NEGATIVE
Glucose, UA: NEGATIVE
SPEC GRAV UA: 1.025
UROBILINOGEN UA: NEGATIVE
pH, UA: 6

## 2016-10-01 MED ORDER — CIPROFLOXACIN HCL 500 MG PO TABS: 500.0000 mg | ORAL_TABLET | Freq: Two times a day (BID) | ORAL | 0 refills | Status: DC

## 2016-10-01 MED ORDER — NAPROXEN 500 MG PO TABS: ORAL_TABLET | ORAL | 0 refills | Status: DC

## 2016-10-01 MED ORDER — CEFTRIAXONE SODIUM 1 G IJ SOLR
1.0000 g | Freq: Once | INTRAMUSCULAR | Status: AC
  Administered 2016-10-01: 1 g via INTRAMUSCULAR

## 2016-10-01 NOTE — Assessment & Plan Note (Addendum)
UA consistent with infection, with nausea and CVA tenderness concern for pyelo - treat with rocephin IM 1gm today then 7d ciprofloxacin course.  Naprosyn PRN back discomfort.  Red flags to seek urgent care reviewed.  UCx sent.  Update if no better for further evaluation.

## 2016-10-01 NOTE — Progress Notes (Signed)
Pre visit review using our clinic review tool, if applicable. No additional management support is needed unless otherwise documented below in the visit note. 

## 2016-10-01 NOTE — Patient Instructions (Signed)
You have acute kidney infection. Treat with shot of rocephin today then take antibiotic twice daily for 7 days. For back discomfort, treat with naprosyn 500mg  twice daily with food for 5 days then as needed. Drink lots of fluid and get plenty of rest. If not improving in 3 days, please return to see us for further evaluation. Schedule physical with Dr Jonny RuizJohn as you're due.  Good to see you today, call us with questions. Return if not improving with treatment.    Pyelonephritis, Adult Introduction Pyelonephritis is a kidney infection. The kidneys are the organs that filter a person's blood and move waste out of the bloodstream and into the urine. Urine passes from the kidneys, through the ureters, and into the bladder. There are two main types of pyelonephritis:  Infections that come on quickly without any warning (acute pyelonephritis).  Infections that last for a long period of time (chronic pyelonephritis). In most cases, the infection clears up with treatment and does not cause further problems. More severe infections or chronic infections can sometimes spread to the bloodstream or lead to other problems with the kidneys. What are the causes? This condition is usually caused by:  Bacteria traveling from the bladder to the kidney through infected urine. The urine in the bladder can become infected with bacteria from:  Bladder infection (cystitis).  Inflammation of the prostate gland (prostatitis).  Sexual intercourse, in females.  Bacteria traveling from the bloodstream to the kidney. What increases the risk? This condition is more likely to develop in:  Pregnant women.  Older people.  People who have diabetes.  People who have kidney stones or bladder stones.  People who have other abnormalities of the kidney or ureter.  People who have a catheter placed in the bladder.  People who have cancer.  People who are sexually active.  Women who use spermicides.  People who  have had a prior urinary tract infection. What are the signs or symptoms? Symptoms of this condition include:  Frequent urination.  Strong or persistent urge to urinate.  Burning or stinging when urinating.  Abdominal pain.  Back pain.  Pain in the side or flank area.  Fever.  Chills.  Blood in the urine, or dark urine.  Nausea.  Vomiting. How is this diagnosed? This condition may be diagnosed based on:  Medical history and physical exam.  Urine tests.  Blood tests. You may also have imaging tests of the kidneys, such as an ultrasound or CT scan. How is this treated? Treatment for this condition may depend on the severity of the infection.  If the infection is mild and is found early, you may be treated with antibiotic medicines taken by mouth. You will need to drink fluids to remain hydrated.  If the infection is more severe, you may need to stay in the hospital and receive antibiotics given directly into a vein through an IV tube. You may also need to receive fluids through an IV tube if you are not able to remain hydrated. After your hospital stay, you may need to take oral antibiotics for a period of time. Other treatments may be required, depending on the cause of the infection. Follow these instructions at home: Medicines  Take over-the-counter and prescription medicines only as told by your health care provider.  If you were prescribed an antibiotic medicine, take it as told by your health care provider. Do not stop taking the antibiotic even if you start to feel better. General instructions  Drink enough  fluid to keep your urine clear or pale yellow.  Avoid caffeine, tea, and carbonated beverages. They tend to irritate the bladder.  Urinate often. Avoid holding in urine for long periods of time.  Urinate before and after sex.  After a bowel movement, women should cleanse from front to back. Use each tissue only once.  Keep all follow-up visits as  told by your health care provider. This is important. Contact a health care provider if:  Your symptoms do not get better after 2 days of treatment.  Your symptoms get worse.  You have a fever. Get help right away if:  You are unable to take your antibiotics or fluids.  You have shaking chills.  You vomit.  You have severe flank or back pain.  You have extreme weakness or fainting. This information is not intended to replace advice given to you by your health care provider. Make sure you discuss any questions you have with your health care provider. Document Released: 08/15/2005 Document Revised: 01/21/2016 Document Reviewed: 12/08/2014  2017 [employer]

## 2016-10-01 NOTE — Progress Notes (Signed)
BP 140/80   Pulse 74   Temp 98.5 F (36.9 C) (Oral)   Resp 20   Wt 127 lb 8 oz (57.8 kg)   SpO2 98%   BMI 17.78 kg/m    CC: UTI? Subjective:    Patient ID: Jeff Moore, male    DOB: Feb 24, 1956, 61 y.o.   MRN: [id number]  HPI: Jeff Moore is a 61 y.o. male presenting on 10/01/2016 for Urinary Tract Infection (back pain, burning with urination, frequent urination)   2d h/o lower back pain associated with dysuria, urgency, frequency, incomplete emptying. Nausea 2d ago. No fevers/chills, abd pain, vomiting, hematuria. No prior UTI or prostatitis. No recent antibiotics.   He had mild case of flu a few weeks ago - low grade fever, cough with green phlegm, congestion, body aches self treated with OTC remedies. Ongoing congestion but other symptoms are better.   Relevant past medical, surgical, family and social history reviewed and updated as indicated. Interim medical history since our last visit reviewed. Allergies and medications reviewed and updated. Current Outpatient Prescriptions on File Prior to Visit  Medication Sig  . aspirin EC 81 MG tablet Take 1 tablet (81 mg total) by mouth daily.  Marland Kitchen PARoxetine (PAXIL) 20 MG tablet Take 1 tablet (20 mg total) by mouth daily.   No current facility-administered medications on file prior to visit.     Review of Systems Per HPI unless specifically indicated in ROS section     Objective:    BP 140/80   Pulse 74   Temp 98.5 F (36.9 C) (Oral)   Resp 20   Wt 127 lb 8 oz (57.8 kg)   SpO2 98%   BMI 17.78 kg/m   Wt Readings from Last 3 Encounters:  10/01/16 127 lb 8 oz (57.8 kg)  08/26/15 122 lb (55.3 kg)  02/24/15 128 lb (58.1 kg)    Physical Exam  Constitutional: He appears well-developed and well-nourished. No distress.  HENT:  Mouth/Throat: Oropharynx is clear and moist. No oropharyngeal exudate.  Cardiovascular: Normal rate, regular rhythm, normal heart sounds and intact distal pulses.   No murmur  heard. Pulmonary/Chest: Effort normal and breath sounds normal. No respiratory distress. He has no wheezes. He has no rales.  Abdominal: Soft. Normal appearance and bowel sounds are normal. He exhibits no distension and no mass. There is no hepatosplenomegaly. There is no tenderness. There is CVA tenderness (bilateral L>R). There is no rigidity, no rebound, no guarding and negative Murphy's sign.  Musculoskeletal: He exhibits no edema.  Skin: Skin is warm and dry. No rash noted.  Psychiatric: He has a normal mood and affect.  Nursing note and vitals reviewed.  Results for orders placed or performed in visit on 10/01/16  POCT urinalysis dipstick  Result Value Ref Range   Color, UA Dark yellow    Clarity, UA cloudy    Glucose, UA negative    Bilirubin, UA negative    Ketones, UA +-    Spec Grav, UA 1.025    Blood, UA 3+    pH, UA 6.0    Protein, UA 2+    Urobilinogen, UA negative    Nitrite, UA +    Leukocytes, UA large (3+) (A) Negative   Lab Results  Component Value Date   CREATININE 0.77 08/28/2015      Assessment & Plan:   Problem List Items Addressed This Visit    Acute pyelonephritis - Primary    UA consistent with infection, with  nausea and CVA tenderness concern for pyelo - treat with rocephin IM 1gm today then 7d ciprofloxacin course.  Naprosyn PRN back discomfort.  Red flags to seek urgent care reviewed.  UCx sent.  Update if no better for further evaluation.       Relevant Orders   Urine culture    Other Visit Diagnoses    Dysuria       Relevant Orders   POCT urinalysis dipstick (Completed)       Follow up plan: Return if symptoms worsen or fail to improve.  Eustaquio BoydenJavier Ivelis Norgard, MD

## 2016-10-03 LAB — URINE CULTURE: Culture: >=100000 — AB

## 2016-10-10 ENCOUNTER — Encounter: Payer: Self-pay | Admitting: *Deleted

## 2016-10-22 ENCOUNTER — Other Ambulatory Visit: Payer: Self-pay | Admitting: Internal Medicine

## 2016-12-29 ENCOUNTER — Other Ambulatory Visit: Payer: Self-pay | Admitting: Internal Medicine

## 2016-12-30 ENCOUNTER — Telehealth: Payer: Self-pay | Admitting: Internal Medicine

## 2016-12-30 NOTE — Telephone Encounter (Signed)
Pt has been informed that medication can not be refilled until he is seen in the office per Dr. Jonny Moore.

## 2016-12-30 NOTE — Telephone Encounter (Signed)
Pt has not been seen in 2 years. Called and wanted a refill of PARoxetine (PAXIL) 20 MG tablet. Pharmacy would not refill it due to him not being seen. Pt has 2 refills left.  I made an appt for him for 7/3. Would like Ok to refill it. May need PA   [facility] [address] in [place].

## 2017-01-04 ENCOUNTER — Other Ambulatory Visit (INDEPENDENT_AMBULATORY_CARE_PROVIDER_SITE_OTHER): Payer: 59

## 2017-01-04 ENCOUNTER — Encounter: Payer: Self-pay | Admitting: Internal Medicine

## 2017-01-04 ENCOUNTER — Ambulatory Visit (INDEPENDENT_AMBULATORY_CARE_PROVIDER_SITE_OTHER): Payer: 59 | Admitting: Internal Medicine

## 2017-01-04 VITALS — BP 118/72 | HR 86 | Ht 71.0 in | Wt 131.0 lb

## 2017-01-04 DIAGNOSIS — Z114 Encounter for screening for human immunodeficiency virus [HIV]: Secondary | ICD-10-CM | POA: Diagnosis not present

## 2017-01-04 DIAGNOSIS — Z1159 Encounter for screening for other viral diseases: Secondary | ICD-10-CM

## 2017-01-04 DIAGNOSIS — F32A Depression, unspecified: Secondary | ICD-10-CM

## 2017-01-04 DIAGNOSIS — Z0000 Encounter for general adult medical examination without abnormal findings: Secondary | ICD-10-CM

## 2017-01-04 DIAGNOSIS — F17200 Nicotine dependence, unspecified, uncomplicated: Secondary | ICD-10-CM

## 2017-01-04 DIAGNOSIS — F329 Major depressive disorder, single episode, unspecified: Secondary | ICD-10-CM

## 2017-01-04 DIAGNOSIS — Z23 Encounter for immunization: Secondary | ICD-10-CM

## 2017-01-04 LAB — HEPATIC FUNCTION PANEL
ALT: 12 U/L (ref 0–53)
AST: 17 U/L (ref 0–37)
Albumin: 4.2 g/dL (ref 3.5–5.2)
Alkaline Phosphatase: 71 U/L (ref 39–117)
BILIRUBIN TOTAL: 0.3 mg/dL (ref 0.2–1.2)
Bilirubin, Direct: 0.1 mg/dL (ref 0.0–0.3)
Total Protein: 6.7 g/dL (ref 6.0–8.3)

## 2017-01-04 LAB — URINALYSIS, ROUTINE W REFLEX MICROSCOPIC
Bilirubin Urine: NEGATIVE
Hgb urine dipstick: NEGATIVE
Ketones, ur: NEGATIVE
Leukocytes, UA: NEGATIVE
Nitrite: NEGATIVE
PH: 6 (ref 5.0–8.0)
RBC / HPF: NONE SEEN (ref 0–?)
SPECIFIC GRAVITY, URINE: 1.02 (ref 1.000–1.030)
TOTAL PROTEIN, URINE-UPE24: NEGATIVE
UROBILINOGEN UA: 0.2 (ref 0.0–1.0)
Urine Glucose: NEGATIVE

## 2017-01-04 LAB — CBC WITH DIFFERENTIAL/PLATELET
BASOS ABS: 0.1 10*3/uL (ref 0.0–0.1)
Basophils Relative: 0.8 % (ref 0.0–3.0)
Eosinophils Absolute: 0.2 10*3/uL (ref 0.0–0.7)
Eosinophils Relative: 2 % (ref 0.0–5.0)
HCT: 42.3 % (ref 39.0–52.0)
Hemoglobin: 14.2 g/dL (ref 13.0–17.0)
LYMPHS ABS: 1.7 10*3/uL (ref 0.7–4.0)
Lymphocytes Relative: 19.2 % (ref 12.0–46.0)
MCHC: 33.5 g/dL (ref 30.0–36.0)
MCV: 95.3 fl (ref 78.0–100.0)
MONO ABS: 1 10*3/uL (ref 0.1–1.0)
Monocytes Relative: 11.7 % (ref 3.0–12.0)
NEUTROS PCT: 66.3 % (ref 43.0–77.0)
Neutro Abs: 5.8 10*3/uL (ref 1.4–7.7)
PLATELETS: 238 10*3/uL (ref 150.0–400.0)
RBC: 4.44 Mil/uL (ref 4.22–5.81)
RDW: 13.9 % (ref 11.5–15.5)
WBC: 8.7 10*3/uL (ref 4.0–10.5)

## 2017-01-04 LAB — LIPID PANEL
CHOLESTEROL: 146 mg/dL (ref 0–200)
HDL: 77 mg/dL (ref >39.00)
LDL Cholesterol: 57 mg/dL (ref 0–99)
NonHDL: 69.43
TRIGLYCERIDES: 62 mg/dL (ref 0.0–149.0)
Total CHOL/HDL Ratio: 2
VLDL: 12.4 mg/dL (ref 0.0–40.0)

## 2017-01-04 LAB — PSA: PSA: 1.27 ng/mL (ref 0.10–4.00)

## 2017-01-04 LAB — BASIC METABOLIC PANEL
BUN: 18 mg/dL (ref 6–23)
CHLORIDE: 104 meq/L (ref 96–112)
CO2: 29 mEq/L (ref 19–32)
Calcium: 9.3 mg/dL (ref 8.4–10.5)
Creatinine, Ser: 0.89 mg/dL (ref 0.40–1.50)
GFR: 92.79 mL/min (ref >60.00)
GLUCOSE: 89 mg/dL (ref 70–99)
POTASSIUM: 4.2 meq/L (ref 3.5–5.1)
Sodium: 138 mEq/L (ref 135–145)

## 2017-01-04 LAB — TSH: TSH: 1.73 u[IU]/mL (ref 0.35–4.50)

## 2017-01-04 MED ORDER — ESCITALOPRAM OXALATE 10 MG PO TABS: 10.0000 mg | ORAL_TABLET | Freq: Every day | ORAL | 3 refills | Status: DC

## 2017-01-04 MED ORDER — VARENICLINE TARTRATE 1 MG PO TABS: 1.0000 mg | ORAL_TABLET | Freq: Two times a day (BID) | ORAL | 1 refills | Status: AC

## 2017-01-04 MED ORDER — VARENICLINE TARTRATE 0.5 MG X 11 & 1 MG X 42 PO MISC: ORAL | 0 refills | Status: AC

## 2017-01-04 NOTE — Patient Instructions (Addendum)
You had the Tetanus Tdap shot today  Please take all new medication as prescribed - the lexapro (sent to [facility])  Please take all new medication as prescribed - the Chantix for smoking  - done in hardcopy  Please continue all other medications as before, and refills have been done if requested.  Please have the pharmacy call with any other refills you may need.  Please continue your efforts at being more active, low cholesterol diet, and weight control.  You are otherwise up to date with prevention measures today.  You will be contacted regarding the referral for: colonoscopy  Please keep your appointments with your specialists as you may have planned  Please go to the LAB in the Basement (turn left off the elevator) for the tests to be done today  You will be contacted by phone if any changes need to be made immediately.  Otherwise, you will receive a letter about your results with an explanation, but please check with MyChart first.  Please remember to sign up for MyChart if you have not done so, as this will be important to you in the future with finding out test results, communicating by private email, and scheduling acute appointments online when needed.  Please return in 1 year for your yearly visit, or sooner if needed, with Lab testing done 3-5 days before

## 2017-01-04 NOTE — Progress Notes (Signed)
Subjective:    Patient ID: Jeff Moore, male    DOB: Dec 21, 1955, 61 y.o.   MRN: [id number]  HPI  Here for wellness and f/u;  Overall doing ok;  Pt denies Chest pain, worsening SOB, DOE, wheezing, orthopnea, PND, worsening LE edema, palpitations, dizziness or syncope.  Pt denies neurological change such as new headache, facial or extremity weakness.  Pt denies polydipsia, polyuria, or low sugar symptoms. Pt states overall good compliance with treatment and medications, good tolerability, and has been trying to follow appropriate diet.   No fever, night sweats, wt loss, loss of appetite, or other constitutional symptoms.  Pt states good ability with ADL's, has low fall risk, home safety reviewed and adequate, no other significant changes in hearing or vision, and only occasionally active with exercise. Has really been working on diet - cut out sodas with all the sugar.   Stopped paxil as seemed to stop working so well, and is ok for change to lexapro.  Did have UTi a few months ago, now resolved, but Denies worsening depressive symptoms, suicidal ideation, or panic; has ongoing anxiety.    Drinks 3-4 beers per day,  Still smoking < 1/2 ppd, may be thinking of quitting.  Has not tried chantix Past Medical History:  Diagnosis Date  . ALCOHOL ABUSE 06/04/2010  . COLONIC POLYPS, HX OF 05/17/2010  . Dental crowns present   . DEPRESSION 05/17/2010  . Femoral hernia 05/2012   left  . History of MRSA infection 2003  . Rash 06/07/2012   forehead   Past Surgical History:  Procedure Laterality Date  . FEMORAL HERNIA REPAIR  06/13/2012   Procedure: HERNIA REPAIR FEMORAL;  Surgeon: Robyne AskewPaul S [name], MD;  Location: [facility];  Service: General;  Laterality: Left;  Left femoral hernia repair with mesh  . INGUINAL HERNIA REPAIR     right  . stress test negative for ischemia  Jan. 2005    reports that he has been smoking Cigarettes.  He has a 20.00 pack-year smoking history. He has never used  smokeless tobacco. He reports that he drinks alcohol. He reports that he does not use drugs. family history includes Cancer in his mother; Heart disease in his father. No Known Allergies Current Outpatient Prescriptions on File Prior to Visit  Medication Sig Dispense Refill  . aspirin EC 81 MG tablet Take 1 tablet (81 mg total) by mouth daily. 90 tablet 11   No current facility-administered medications on file prior to visit.    Review of Systems Constitutional: Negative for other unusual diaphoresis, sweats, appetite or weight changes HENT: Negative for other worsening hearing loss, ear pain, facial swelling, mouth sores or neck stiffness.   Eyes: Negative for other worsening pain, redness or other visual disturbance.  Respiratory: Negative for other stridor or swelling Cardiovascular: Negative for other palpitations or other chest pain  Gastrointestinal: Negative for worsening diarrhea or loose stools, blood in stool, distention or other pain Genitourinary: Negative for hematuria, flank pain or other change in urine volume.  Musculoskeletal: Negative for myalgias or other joint swelling.  Skin: Negative for other color change, or other wound or worsening drainage.  Neurological: Negative for other syncope or numbness. Hematological: Negative for other adenopathy or swelling Psychiatric/Behavioral: Negative for hallucinations, other worsening agitation, SI, self-injury, or new decreased concentration All other system neg per pt    Objective:   Physical Exam BP 118/72   Pulse 86   Ht 5\' 11"  (1.803 m)  Wt 131 lb (59.4 kg)   SpO2 100%   BMI 18.27 kg/m  VS noted,  Constitutional: Pt is oriented to person, place, and time. Appears well-developed and well-nourished, in no significant distress and comfortable Head: Normocephalic and atraumatic  Eyes: Conjunctivae and EOM are normal. Pupils are equal, round, and reactive to light Right Ear: External ear normal without discharge Left  Ear: External ear normal without discharge Nose: Nose without discharge or deformity Mouth/Throat: Oropharynx is without other ulcerations and moist  Neck: Normal range of motion. Neck supple. No JVD present. No tracheal deviation present or significant neck LA or mass Cardiovascular: Normal rate, regular rhythm, normal heart sounds and intact distal pulses.   Pulmonary/Chest: WOB normal and breath sounds without rales or wheezing  Abdominal: Soft. Bowel sounds are normal. NT. No HSM  Musculoskeletal: Normal range of motion. Exhibits no edema Lymphadenopathy: Has no other cervical adenopathy.  Neurological: Pt is alert and oriented to person, place, and time. Pt has normal reflexes. No cranial nerve deficit. Motor grossly intact, Gait intact Skin: Skin is warm and dry. No rash noted or new ulcerations Psychiatric:  Has normal mood and affect. Behavior is normal without agitation No other exam findings Lab Results  Component Value Date   WBC 10.1 08/26/2015   HGB 14.8 08/26/2015   HCT 45.0 08/26/2015   PLT 234.0 08/26/2015   GLUCOSE 97 08/28/2015   CHOL 147 08/26/2015   TRIG 104.0 08/26/2015   HDL 58.30 08/26/2015   LDLCALC 68 08/26/2015   ALT 22 08/26/2015   AST 24 08/26/2015   NA 137 08/28/2015   K 4.1 08/28/2015   CL 103 08/28/2015   CREATININE 0.77 08/28/2015   BUN 13 08/28/2015   CO2 29 08/28/2015   TSH 0.78 08/26/2015   PSA 1.58 08/26/2015       Assessment & Plan:

## 2017-01-04 NOTE — Assessment & Plan Note (Signed)
Ok for change paxil to lexapro 10 qd which has helped in the past

## 2017-01-04 NOTE — Assessment & Plan Note (Signed)
Ok for chantix asd,  to f/u any worsening symptoms or concerns 

## 2017-01-04 NOTE — Addendum Note (Signed)
Addended by: Roney MansGAY, Durand Wittmeyer on: 01/04/2017 01:59 PM   Modules accepted: Orders

## 2017-01-04 NOTE — Assessment & Plan Note (Signed)

## 2017-01-05 LAB — HIV ANTIBODY (ROUTINE TESTING W REFLEX): HIV: NONREACTIVE

## 2017-01-05 LAB — HEPATITIS C ANTIBODY: HCV AB: NEGATIVE

## 2017-02-28 ENCOUNTER — Encounter: Payer: 59 | Admitting: Internal Medicine

## 2017-03-15 ENCOUNTER — Encounter: Payer: Self-pay | Admitting: Internal Medicine

## 2017-06-23 ENCOUNTER — Ambulatory Visit (INDEPENDENT_AMBULATORY_CARE_PROVIDER_SITE_OTHER): Payer: 59 | Admitting: Nurse Practitioner

## 2017-06-23 ENCOUNTER — Encounter: Payer: Self-pay | Admitting: Nurse Practitioner

## 2017-06-23 VITALS — BP 134/86 | HR 100 | Temp 98.4°F | Ht 71.0 in | Wt 125.0 lb

## 2017-06-23 DIAGNOSIS — F411 Generalized anxiety disorder: Secondary | ICD-10-CM | POA: Diagnosis not present

## 2017-06-23 DIAGNOSIS — F329 Major depressive disorder, single episode, unspecified: Secondary | ICD-10-CM | POA: Diagnosis not present

## 2017-06-23 DIAGNOSIS — F1010 Alcohol abuse, uncomplicated: Secondary | ICD-10-CM | POA: Diagnosis not present

## 2017-06-23 DIAGNOSIS — F32A Depression, unspecified: Secondary | ICD-10-CM

## 2017-06-23 MED ORDER — BUPROPION HCL ER (SR) 100 MG PO TB12: 100.0000 mg | ORAL_TABLET | Freq: Two times a day (BID) | ORAL | 0 refills | Status: DC

## 2017-06-23 MED ORDER — NALTREXONE HCL 50 MG PO TABS: 50.0000 mg | ORAL_TABLET | Freq: Every day | ORAL | 0 refills | Status: AC

## 2017-06-23 MED ORDER — DIAZEPAM 5 MG PO TABS: 5.0000 mg | ORAL_TABLET | Freq: Four times a day (QID) | ORAL | 0 refills | Status: AC | PRN

## 2017-06-23 NOTE — Assessment & Plan Note (Addendum)
Drinks >6 12oz bottles pf beer per day. Hx of illicit drug abuse. Denies any current use. He wants to quit ETOH use. Naltrexone and valium Rx given today. Advised to join AA group. Entered ref to psychology. F/up in 1week. Needs UDS during next OV

## 2017-06-23 NOTE — Progress Notes (Signed)
Subjective:  Patient ID: Jeff Moore, male    DOB: 21-Sep-1955  Age: 61 y.o. MRN: [id number]  CC: Medication Problem (lexapro consult--having mood swing--sister is here) and Follow-up (quit drinking consult?)  Accompanied by sister today.  Anxiety  Presents for follow-up visit. Symptoms include depressed mood, excessive worry, irritability, muscle tension, nervous/anxious behavior and restlessness. Patient reports no hyperventilation, insomnia, malaise or suicidal ideas. Symptoms occur constantly. The severity of symptoms is interfering with daily activities and causing significant distress. The quality of sleep is fair. Nighttime awakenings: occasional.   Compliance with medications is 76-100%.  FH of depression (father and grand father).  Used paxil in past, it caused caused dizziness. Med was stopped 12/2016. Started lexapro 12/2016, developed mood swings in last 2months, no hallucinations, no SI or HI Hx of drug abuse (oral, nasal and intravenous). Current tobacco use (daily).  ETOH Abuse: Drinks > 6packs-12oz of beer a day on weekday Drinks about 12 pack on his day off. Reports he does not drink before going to work. He admits to skipping meals when drinking. Denies comsuming ETOH first thing in morning. Denies any MVA due to ETOH intoxication.  Reports He was incarcerated over 94yrs ago and was forced to detox. He was sent to inpatient rehab and half way house at that time. ETOH use resumed after his mother died.  He reports increased irritability at work and at home in past 2months.   Reviewed controlled substance database: no profile found  Outpatient Medications Prior to Visit  Medication Sig Dispense Refill  . aspirin EC 81 MG tablet Take 1 tablet (81 mg total) by mouth daily. 90 tablet 11  . varenicline (CHANTIX CONTINUING MONTH PAK) 1 MG tablet Take 1 tablet (1 mg total) by mouth 2 (two) times daily. (Patient not taking: Reported on 06/23/2017) 60 tablet 1  .  varenicline (CHANTIX STARTING MONTH PAK) 0.5 MG X 11 & 1 MG X 42 tablet Take one 0.5 mg tablet by mouth once daily for 3 days, then increase to one 0.5 mg tablet twice daily for 4 days, then increase to one 1 mg tablet twice daily. (Patient not taking: Reported on 06/23/2017) 53 tablet 0  . escitalopram (LEXAPRO) 10 MG tablet Take 1 tablet (10 mg total) by mouth daily. 90 tablet 3   No facility-administered medications prior to visit.     ROS See HPI  Objective:  BP 134/86   Pulse 100   Temp 98.4 F (36.9 C)   Ht 5\' 11"  (1.803 m)   Wt 125 lb (56.7 kg)   SpO2 98%   BMI 17.43 kg/m   BP Readings from Last 3 Encounters:  06/23/17 134/86  01/04/17 118/72  10/01/16 140/80    Wt Readings from Last 3 Encounters:  06/23/17 125 lb (56.7 kg)  01/04/17 131 lb (59.4 kg)  10/01/16 127 lb 8 oz (57.8 kg)    Physical Exam  Constitutional: He is oriented to person, place, and time. No distress.  Neck: No thyromegaly present.  Cardiovascular: Normal rate and regular rhythm.   Pulmonary/Chest: Effort normal and breath sounds normal.  Musculoskeletal: Normal range of motion. He exhibits no edema.  Neurological: He is alert and oriented to person, place, and time.  Skin: Skin is warm and dry.  Psychiatric: Thought content normal.  Appears aggitated (shaking leg and wrinking his hands)  Vitals reviewed.   Lab Results  Component Value Date   WBC 8.7 01/04/2017   HGB 14.2 01/04/2017   HCT 42.3  01/04/2017   PLT 238.0 01/04/2017   GLUCOSE 89 01/04/2017   CHOL 146 01/04/2017   TRIG 62.0 01/04/2017   HDL 77.00 01/04/2017   LDLCALC 57 01/04/2017   ALT 12 01/04/2017   AST 17 01/04/2017   NA 138 01/04/2017   K 4.2 01/04/2017   CL 104 01/04/2017   CREATININE 0.89 01/04/2017   BUN 18 01/04/2017   CO2 29 01/04/2017   TSH 1.73 01/04/2017   PSA 1.27 01/04/2017    No results found.  Assessment & Plan:   Iantha FallenKenneth was seen today for medication problem and follow-up.  Diagnoses and  all orders for this visit:  Alcohol abuse -     naltrexone (DEPADE) 50 MG tablet; Take 1 tablet (50 mg total) by mouth daily. -     diazepam (VALIUM) 5 MG tablet; Take 1 tablet (5 mg total) by mouth every 6 (six) hours as needed for anxiety. -     Ambulatory referral to Psychology  Anxiety state -     buPROPion (WELLBUTRIN SR) 100 MG 12 hr tablet; Take 1 tablet (100 mg total) by mouth 2 (two) times daily. -     Ambulatory referral to Psychology  Depression, unspecified depression type -     buPROPion (WELLBUTRIN SR) 100 MG 12 hr tablet; Take 1 tablet (100 mg total) by mouth 2 (two) times daily. -     Ambulatory referral to Psychology   I have discontinued Mr. Copelan's escitalopram. I am also having him start on buPROPion, naltrexone, and diazepam. Additionally, I am having him maintain his aspirin EC, varenicline, and varenicline.  Meds ordered this encounter  Medications  . buPROPion (WELLBUTRIN SR) 100 MG 12 hr tablet    Sig: Take 1 tablet (100 mg total) by mouth 2 (two) times daily.    Dispense:  30 tablet    Refill:  0    Order Specific Question:   Supervising Provider    Answer:   Tresa GarterPLOTNIKOV, ALEKSEI V [[id number]]  . naltrexone (DEPADE) 50 MG tablet    Sig: Take 1 tablet (50 mg total) by mouth daily.    Dispense:  30 tablet    Refill:  0    Order Specific Question:   Supervising Provider    Answer:   Tresa GarterPLOTNIKOV, ALEKSEI V [[id number]]  . diazepam (VALIUM) 5 MG tablet    Sig: Take 1 tablet (5 mg total) by mouth every 6 (six) hours as needed for anxiety.    Dispense:  20 tablet    Refill:  0    Order Specific Question:   Supervising Provider    Answer:   Tresa GarterPLOTNIKOV, ALEKSEI V [[id number]]   Follow-up: Return in about 10 days (around 07/03/2017) for anxiety and ETOH abuse (needs UDS screen).  Alysia Pennaharlotte Teondre Jarosz, NP

## 2017-06-23 NOTE — Patient Instructions (Addendum)
Stop lexapro. Start wellbutrin tomorrow. Start naltrexone on 06/29/2017.  Use valium as needed for alcohol withdrawal symptoms.  You will be contacted to schedule appt with psychology.  Please find AA group ASAP.  Delirium Tremens Delirium tremens, also known as DTs, is the worst kind of alcohol withdrawal. Alcohol withdrawal is a group of symptoms. It happens when you stop drinking or you drink less. Delirium tremens is serious, and you need to be treated in a hospital. Follow these instructions at home:  Do not drink alcohol.  Take over-the-counter and prescription medicines only as told by your doctor.  Do not drive or use heavy machinery until your doctor approves.  Drink enough fluid to keep your pee (urine) clear or pale yellow.  Have someone stay with you or be available to you in case you need help.  Think about joining an alcohol support group.  Keep all follow-up visits as told by your doctor. This is important. Contact a doctor if:  You have symptoms that get worse or do not go away.  You cannot eat or drink without throwing up (vomiting).  You are having a hard time not drinking alcohol.  You cannot stop drinking alcohol. Get help right away if:  You shake and jerk uncontrollably (you have a seizure).  You have a fever plus any other signs of alcohol withdrawal.  You get very confused.  You feel or see things that other people do not feel or see (you hallucinate).  You get very sleepy.  You throw up (vomit) blood.  You shake or tremble very badly (you have tremors).  You sweat a lot.  You feel like your heart is beating out of rhythm or beating too quickly (you have palpitations).  You have chest pain.  You have trouble breathing. These symptoms may be an emergency. Do not wait to see if the symptoms will go away. Get medical help right away. Call your local emergency services (911 in the [place] Do not drive yourself to the hospital. This  information is not intended to replace advice given to you by your health care provider. Make sure you discuss any questions you have with your health care provider. Document Released: 12/15/2010 Document Revised: 01/21/2016 Document Reviewed: 02/04/2015 Elsevier Interactive Patient Education  2018 [employer].  Alcohol Use Disorder Alcohol use disorder is when your drinking disrupts your daily life. When you have this condition, you drink too much alcohol and you cannot control your drinking. Alcohol use disorder can cause serious problems with your physical health. It can affect your brain, heart, liver, pancreas, immune system, stomach, and intestines. Alcohol use disorder can increase your risk for certain cancers and cause problems with your mental health, such as depression, anxiety, psychosis, delirium, and dementia. People with this disorder risk hurting themselves and others. What are the causes? This condition is caused by drinking too much alcohol over time. It is not caused by drinking too much alcohol only one or two times. Some people with this condition drink alcohol to cope with or escape from negative life events. Others drink to relieve pain or symptoms of mental illness. What increases the risk? You are more likely to develop this condition if:  You have a family history of alcohol use disorder.  Your culture encourages drinking to the point of intoxication, or makes alcohol easy to get.  You had a mood or conduct disorder in childhood.  You have been a victim of abuse.  You are an adolescent and: ?  You have poor grades or difficulties in school. ? Your caregivers do not talk to you about saying no to alcohol, or supervise your activities. ? You are impulsive or you have trouble with self-control.  What are the signs or symptoms? Symptoms of this condition include:  Drinkingmore than you want to.  Drinking for longer than you want to.  Trying several times to  drink less or to control your drinking.  Spending a lot of time getting alcohol, drinking, or recovering from drinking.  Craving alcohol.  Having problems at work, at school, or at home due to drinking.  Having problems in relationships due to drinking.  Drinking when it is dangerous to drink, such as before driving a car.  Continuing to drink even though you know you might have a physical or mental problem related to drinking.  Needing more and more alcohol to get the same effect you want from the alcohol (building up tolerance).  Having symptoms of withdrawal when you stop drinking. Symptoms of withdrawal include: ? Fatigue. ? Nightmares. ? Trouble sleeping. ? Depression. ? Anxiety. ? Fever. ? Seizures. ? Severe confusion. ? Feeling or seeing things that are not there (hallucinations). ? Tremors. ? Rapid heart rate. ? Rapid breathing. ? High blood pressure.  Drinking to avoid symptoms of withdrawal.  How is this diagnosed? This condition is diagnosed with an assessment. Your health care provider may start the assessment by asking three or four questions about your drinking. Your health care provider may perform a physical exam or do lab tests to see if you have physical problems resulting from alcohol use. She or he may refer you to a mental health professional for evaluation. How is this treated? Some people with alcohol use disorder are able to reduce their alcohol use to low-risk levels. Others need to completely quit drinking alcohol. When necessary, mental health professionals with specialized training in substance use treatment can help. Your health care provider can help you decide how severe your alcohol use disorder is and what type of treatment you need. The following forms of treatment are available:  Detoxification. Detoxification involves quitting drinking and using prescription medicines within the first week to help lessen withdrawal symptoms. This treatment is  important for people who have had withdrawal symptoms before and for heavy drinkers who are likely to have withdrawal symptoms. Alcohol withdrawal can be dangerous, and in severe cases, it can cause death. Detoxification may be provided in a home, community, or primary care setting, or in a hospital or substance use treatment facility.  Counseling. This treatment is also called talk therapy. It is provided by substance use treatment counselors. A counselor can address the reasons you use alcohol and suggest ways to keep you from drinking again or to prevent problem drinking. The goals of talk therapy are to: ? Find healthy activities and ways for you to cope with stress. ? Identify and avoid the things that trigger your alcohol use. ? Help you learn how to handle cravings.  Medicines.Medicines can help treat alcohol use disorder by: ? Decreasing alcohol cravings. ? Decreasing the positive feeling you have when you drink alcohol. ? Causing an uncomfortable physical reaction when you drink alcohol (aversion therapy).  Support groups. Support groups are led by people who have quit drinking. They provide emotional support, advice, and guidance.  These forms of treatment are often combined. Some people with this condition benefit from a combination of treatments provided by specialized substance use treatment centers. Follow these instructions  at home:  Take over-the-counter and prescription medicines only as told by your health care provider.  Check with your health care provider before starting any new medicines.  Ask friends and family members not to offer you alcohol.  Avoid situations where alcohol is served, including gatherings where others are drinking alcohol.  Create a plan for what to do when you are tempted to use alcohol.  Find hobbies or activities that you enjoy that do not include alcohol.  Keep all follow-up visits as told by your health care provider. This is  important. How is this prevented?  If you drink, limit alcohol intake to no more than 1 drink a day for nonpregnant women and 2 drinks a day for men. One drink equals 12 oz of beer, 5 oz of wine, or 1 oz of hard liquor.  If you have a mental health condition, get treatment and support.  Do not give alcohol to adolescents.  If you are an adolescent: ? Do not drink alcohol. ? Do not be afraid to say no if someone offers you alcohol. Speak up about why you do not want to drink. You can be a positive role model for your friends and set a good example for those around you by not drinking alcohol. ? If your friends drink, spend time with others who do not drink alcohol. Make new friends who do not use alcohol. ? Find healthy ways to manage stress and emotions, such as meditation or deep breathing, exercise, spending time in nature, listening to music, or talking with a trusted friend or family member. Contact a health care provider if:  You are not able to take your medicines as told.  Your symptoms get worse.  You return to drinking alcohol (relapse) and your symptoms get worse. Get help right away if:  You have thoughts about hurting yourself or others. If you ever feel like you may hurt yourself or others, or have thoughts about taking your own life, get help right away. You can go to your nearest emergency department or call:  Your local emergency services (911 in the [place]  A suicide crisis helpline, such as the National Suicide Prevention Lifeline at 6285294746. This is open 24 hours a day.  Summary  Alcohol use disorder is when your drinking disrupts your daily life. When you have this condition, you drink too much alcohol and you cannot control your drinking.  Treatment may include detoxification, counseling, medicine, and support groups.  Ask friends and family members not to offer you alcohol. Avoid situations where alcohol is served.  Get help right away if you have  thoughts about hurting yourself or others. This information is not intended to replace advice given to you by your health care provider. Make sure you discuss any questions you have with your health care provider. Document Released: 09/22/2004 Document Revised: 05/12/2016 Document Reviewed: 05/12/2016 Elsevier Interactive Patient Education  [employer].

## 2017-06-23 NOTE — Assessment & Plan Note (Signed)
Dizziness with paxil. Irritability with lexapro. Stop lexapro and start wellbutrin.

## 2017-07-04 ENCOUNTER — Ambulatory Visit: Payer: 59 | Admitting: Clinical

## 2017-07-07 ENCOUNTER — Ambulatory Visit: Payer: 59 | Admitting: Internal Medicine

## 2018-01-09 ENCOUNTER — Encounter: Payer: 59 | Admitting: Internal Medicine

## 2021-06-25 ENCOUNTER — Telehealth: Payer: Self-pay | Admitting: Internal Medicine

## 2021-06-25 NOTE — Telephone Encounter (Signed)
Ok with me 

## 2021-06-25 NOTE — Telephone Encounter (Signed)
Previous patient of Dr. Jonny Ruiz (12/2016)  Wants to know if provider is willing to reestablish care so he can schedule an OV  Please advised [phone]

## 2021-07-01 NOTE — Telephone Encounter (Signed)
Left message for patient to call back to schedule appt.

## 2021-07-12 ENCOUNTER — Ambulatory Visit (INDEPENDENT_AMBULATORY_CARE_PROVIDER_SITE_OTHER): Payer: Self-pay

## 2021-07-12 ENCOUNTER — Other Ambulatory Visit: Payer: Self-pay | Admitting: Internal Medicine

## 2021-07-12 ENCOUNTER — Encounter: Payer: Self-pay | Admitting: Internal Medicine

## 2021-07-12 ENCOUNTER — Other Ambulatory Visit: Payer: Self-pay

## 2021-07-12 ENCOUNTER — Telehealth: Payer: Self-pay

## 2021-07-12 ENCOUNTER — Ambulatory Visit (INDEPENDENT_AMBULATORY_CARE_PROVIDER_SITE_OTHER): Payer: Self-pay | Admitting: Internal Medicine

## 2021-07-12 VITALS — BP 140/70 | HR 85 | Temp 98.8°F | Ht 71.0 in | Wt 122.6 lb

## 2021-07-12 DIAGNOSIS — Z0001 Encounter for general adult medical examination with abnormal findings: Secondary | ICD-10-CM

## 2021-07-12 DIAGNOSIS — E538 Deficiency of other specified B group vitamins: Secondary | ICD-10-CM

## 2021-07-12 DIAGNOSIS — F17200 Nicotine dependence, unspecified, uncomplicated: Secondary | ICD-10-CM

## 2021-07-12 DIAGNOSIS — R0600 Dyspnea, unspecified: Secondary | ICD-10-CM

## 2021-07-12 DIAGNOSIS — K610 Anal abscess: Secondary | ICD-10-CM

## 2021-07-12 DIAGNOSIS — F1010 Alcohol abuse, uncomplicated: Secondary | ICD-10-CM

## 2021-07-12 DIAGNOSIS — R911 Solitary pulmonary nodule: Secondary | ICD-10-CM

## 2021-07-12 DIAGNOSIS — Z1211 Encounter for screening for malignant neoplasm of colon: Secondary | ICD-10-CM

## 2021-07-12 DIAGNOSIS — E559 Vitamin D deficiency, unspecified: Secondary | ICD-10-CM | POA: Diagnosis not present

## 2021-07-12 DIAGNOSIS — F411 Generalized anxiety disorder: Secondary | ICD-10-CM

## 2021-07-12 DIAGNOSIS — Z86010 Personal history of colonic polyps: Secondary | ICD-10-CM

## 2021-07-12 DIAGNOSIS — J449 Chronic obstructive pulmonary disease, unspecified: Secondary | ICD-10-CM | POA: Insufficient documentation

## 2021-07-12 DIAGNOSIS — Z23 Encounter for immunization: Secondary | ICD-10-CM

## 2021-07-12 LAB — CBC WITH DIFFERENTIAL/PLATELET
Basophils Absolute: 0.1 10*3/uL (ref 0.0–0.1)
Basophils Relative: 1.1 % (ref 0.0–3.0)
Eosinophils Absolute: 0.1 10*3/uL (ref 0.0–0.7)
Eosinophils Relative: 1.8 % (ref 0.0–5.0)
HCT: 43 % (ref 39.0–52.0)
Hemoglobin: 14.2 g/dL (ref 13.0–17.0)
Lymphocytes Relative: 20.8 % (ref 12.0–46.0)
Lymphs Abs: 1.3 10*3/uL (ref 0.7–4.0)
MCHC: 33 g/dL (ref 30.0–36.0)
MCV: 98.6 fl (ref 78.0–100.0)
Monocytes Absolute: 0.5 10*3/uL (ref 0.1–1.0)
Monocytes Relative: 8.7 % (ref 3.0–12.0)
Neutro Abs: 4.3 10*3/uL (ref 1.4–7.7)
Neutrophils Relative %: 67.6 % (ref 43.0–77.0)
Platelets: 193 10*3/uL (ref 150.0–400.0)
RBC: 4.36 Mil/uL (ref 4.22–5.81)
RDW: 13.4 % (ref 11.5–15.5)
WBC: 6.3 10*3/uL (ref 4.0–10.5)

## 2021-07-12 LAB — LIPID PANEL
Cholesterol: 143 mg/dL (ref 0–200)
HDL: 72.4 mg/dL (ref >39.00)
LDL Cholesterol: 52 mg/dL (ref 0–99)
NonHDL: 70.58
Total CHOL/HDL Ratio: 2
Triglycerides: 93 mg/dL (ref 0.0–149.0)
VLDL: 18.6 mg/dL (ref 0.0–40.0)

## 2021-07-12 LAB — BASIC METABOLIC PANEL
BUN: 11 mg/dL (ref 6–23)
CO2: 30 mEq/L (ref 19–32)
Calcium: 9.1 mg/dL (ref 8.4–10.5)
Chloride: 103 mEq/L (ref 96–112)
Creatinine, Ser: 0.72 mg/dL (ref 0.40–1.50)
GFR: 96.81 mL/min (ref >60.00)
Glucose, Bld: 88 mg/dL (ref 70–99)
Potassium: 4.7 mEq/L (ref 3.5–5.1)
Sodium: 138 mEq/L (ref 135–145)

## 2021-07-12 LAB — URINALYSIS, ROUTINE W REFLEX MICROSCOPIC
Bilirubin Urine: NEGATIVE
Hgb urine dipstick: NEGATIVE
Ketones, ur: NEGATIVE
Leukocytes,Ua: NEGATIVE
Nitrite: NEGATIVE
RBC / HPF: NONE SEEN (ref 0–?)
Specific Gravity, Urine: 1.015 (ref 1.000–1.030)
Total Protein, Urine: NEGATIVE
Urine Glucose: NEGATIVE
Urobilinogen, UA: 0.2 (ref 0.0–1.0)
pH: 6.5 (ref 5.0–8.0)

## 2021-07-12 LAB — PSA: PSA: 0.93 ng/mL (ref 0.10–4.00)

## 2021-07-12 LAB — HEPATIC FUNCTION PANEL
ALT: 18 U/L (ref 0–53)
AST: 22 U/L (ref 0–37)
Albumin: 4.2 g/dL (ref 3.5–5.2)
Alkaline Phosphatase: 76 U/L (ref 39–117)
Bilirubin, Direct: 0.1 mg/dL (ref 0.0–0.3)
Total Bilirubin: 0.4 mg/dL (ref 0.2–1.2)
Total Protein: 6.4 g/dL (ref 6.0–8.3)

## 2021-07-12 LAB — VITAMIN B12: Vitamin B-12: 242 pg/mL (ref 211–911)

## 2021-07-12 LAB — TSH: TSH: 0.93 u[IU]/mL (ref 0.35–5.50)

## 2021-07-12 LAB — BRAIN NATRIURETIC PEPTIDE: Pro B Natriuretic peptide (BNP): 35 pg/mL (ref 0.0–100.0)

## 2021-07-12 LAB — VITAMIN D 25 HYDROXY (VIT D DEFICIENCY, FRACTURES): VITD: 7.29 ng/mL — ABNORMAL LOW (ref 30.00–100.00)

## 2021-07-12 MED ORDER — ALBUTEROL SULFATE HFA 108 (90 BASE) MCG/ACT IN AERS: 2.0000 | INHALATION_SPRAY | Freq: Four times a day (QID) | RESPIRATORY_TRACT | 0 refills | Status: DC | PRN

## 2021-07-12 MED ORDER — ALBUTEROL SULFATE HFA 108 (90 BASE) MCG/ACT IN AERS: 2.0000 | INHALATION_SPRAY | Freq: Four times a day (QID) | RESPIRATORY_TRACT | 11 refills | Status: DC | PRN

## 2021-07-12 NOTE — Telephone Encounter (Signed)
Please advise as Jeff Moore with [facility] radiology has called and stated pt Chest x-Jeff Moore showed  "Emphysema. Questionable small nodular opacity in the medial apical right lung. Recommend further evaluation with chest CT with IV contrast. No acute consolidative airspace disease to suggest a pneumonia."

## 2021-07-12 NOTE — Patient Instructions (Addendum)
You had the flu shot today  Please take all new medication as prescribed  - the albuterol inhaler as needed for shortness of breath  Please continue all other medications as before, and refills have been done if requested.  Please have the pharmacy call with any other refills you may need.  Please continue your efforts at being more active, low cholesterol diet, and weight control.  You are otherwise up to date with prevention measures today.  Please keep your appointments with your specialists as you may have planned  You will be contacted regarding the referral for: pulmonary for COPD and the lung cancer screening program, and the colonoscopy  Please go to the XRAY Department in the first floor for the x-ray testing  Please go to the LAB at the blood drawing area for the tests to be done  You will be contacted by phone if any changes need to be made immediately.  Otherwise, you will receive a letter about your results with an explanation, but please check with MyChart first.  Please remember to sign up for MyChart if you have not done so, as this will be important to you in the future with finding out test results, communicating by private email, and scheduling acute appointments online when needed.  Please make an Appointment to return in 6 months, or sooner if needed

## 2021-07-12 NOTE — Assessment & Plan Note (Signed)
Last vitamin D Lab Results  Component Value Date   VD25OH 7.29 (L) 07/12/2021   Low, to start oral replacement

## 2021-07-12 NOTE — Progress Notes (Signed)
Patient ID: Jeff Moore, male   DOB: [birth year], 65 y.o.   MRN: [id number]         Chief Complaint:: wellness exam ad New Patient (Initial Visit) (Re-establish care) With dyspnea, smoking, copd, buttock boil recent, ETOH use       HPI:  Jeff Moore is a 65 y.o. male here for wellness exam; due for colonoscopy, declines covid booster, shingrix and pneumovax, o/w up to date but ok for flu shot today                        Also has a significant boil to the right buttock perianal area now much improved with only mild tender swelling leftover, did not drain that he knew, denies fever, chills.  ETOH is down to 2-3 beers per day, trying to wean further.  Smoking continues at < 1 ppd, not ready to quit for now.  Denies worsening depressive symptoms, suicidal ideation, or panic; has ongoing anxiety, Also c/o worsening dyspnea over the past year with exertion, Pt denies chest pain, wheezing, orthopnea, PND, increased LE swelling, palpitations, dizziness or syncope.  Asking for LDCT program inclusion.   Pt denies polydipsia, polyuria, or new focal neuro s/s.   Pt denies fever, night sweats, loss of appetite, or other constitutional symptoms, though has lost wt over the past 4 yrs.   Wt Readings from Last 3 Encounters:  07/12/21 122 lb 9.6 oz (55.6 kg)  06/23/17 125 lb (56.7 kg)  01/04/17 131 lb (59.4 kg)   BP Readings from Last 3 Encounters:  07/12/21 140/70  06/23/17 134/86  01/04/17 118/72   Immunization History  Administered Date(s) Administered   Influenza Split 06/06/2011   Influenza Whole 06/04/2010   Influenza,inj,Quad PF,6+ Mos 07/12/2021   Influenza-Unspecified 06/29/2015   PFIZER(Purple Top)SARS-COV-2 Vaccination 12/07/2019, 01/01/2020, 07/22/2020   Pneumococcal Polysaccharide-23 05/29/2008   Td 04/30/2007   Tdap 01/04/2017   Health Maintenance Due  Topic Date Due   COLONOSCOPY (Pts 45-32yrs Insurance coverage will need to be confirmed)  07/11/2012      Past Medical  History:  Diagnosis Date   ALCOHOL ABUSE 06/04/2010   COLONIC POLYPS, HX OF 05/17/2010   Dental crowns present    DEPRESSION 05/17/2010   Femoral hernia 05/2012   left   History of MRSA infection 2003   Rash 06/07/2012   forehead   Past Surgical History:  Procedure Laterality Date   FEMORAL HERNIA REPAIR  06/13/2012   Procedure: HERNIA REPAIR FEMORAL;  Surgeon: Robyne Askew, MD;  Location: [facility];  Service: General;  Laterality: Left;  Left femoral hernia repair with mesh   INGUINAL HERNIA REPAIR     right   stress test negative for ischemia  Jan. 2005    reports that he has been smoking cigarettes. He has a 24.00 pack-year smoking history. He has never used smokeless tobacco. He reports current alcohol use. He reports that he does not use drugs. family history includes Cancer in his mother; Heart disease in his father. No Known Allergies Current Outpatient Medications on File Prior to Visit  Medication Sig Dispense Refill   aspirin EC 81 MG tablet Take 1 tablet (81 mg total) by mouth daily. 90 tablet 11   Multiple Vitamin (MULTIVITAMIN) tablet Take 1 tablet by mouth daily.     TURMERIC PO Take by mouth.     buPROPion (WELLBUTRIN SR) 100 MG 12 hr tablet Take 1 tablet (100 mg total)  by mouth 2 (two) times daily. (Patient not taking: Reported on 07/12/2021) 30 tablet 0   diazepam (VALIUM) 5 MG tablet Take 1 tablet (5 mg total) by mouth every 6 (six) hours as needed for anxiety. (Patient not taking: Reported on 07/12/2021) 20 tablet 0   naltrexone (DEPADE) 50 MG tablet Take 1 tablet (50 mg total) by mouth daily. (Patient not taking: Reported on 07/12/2021) 30 tablet 0   varenicline (CHANTIX CONTINUING MONTH PAK) 1 MG tablet Take 1 tablet (1 mg total) by mouth 2 (two) times daily. (Patient not taking: No sig reported) 60 tablet 1   varenicline (CHANTIX STARTING MONTH PAK) 0.5 MG X 11 & 1 MG X 42 tablet Take one 0.5 mg tablet by mouth once daily for 3 days, then increase  to one 0.5 mg tablet twice daily for 4 days, then increase to one 1 mg tablet twice daily. (Patient not taking: No sig reported) 53 tablet 0   No current facility-administered medications on file prior to visit.        ROS:  All others reviewed and negative.  Objective        PE:  BP 140/70 (BP Location: Right Arm, Patient Position: Sitting, Cuff Size: Large)   Pulse 85   Temp 98.8 F (37.1 C) (Oral)   Ht 5\' 11"  (1.803 m)   Wt 122 lb 9.6 oz (55.6 kg)   SpO2 98%   BMI 17.10 kg/m                 Constitutional: Pt appears in NAD               HENT: Head: NCAT.                Right Ear: External ear normal.                 Left Ear: External ear normal.                Eyes: . Pupils are equal, round, and reactive to light. Conjunctivae and EOM are normal               Nose: without d/c or deformity               Neck: Neck supple. Gross normal ROM               Cardiovascular: Normal rate and regular rhythm.                 Pulmonary/Chest: Effort normal and breath sounds without rales or wheezing.                Abd:  Soft, NT, ND, + BS, no organomegaly; right buttock perianal area with mild tender indurated area < 1 cm only               Neurological: Pt is alert. At baseline orientation, motor grossly intact               Skin: Skin is warm. No rashes, no other new lesions, LE edema - none               Psychiatric: Pt behavior is normal without agitation   Micro: none  Cardiac tracings I have personally interpreted today:  none  Pertinent Radiological findings (summarize): none   Lab Results  Component Value Date   WBC 6.3 07/12/2021   HGB 14.2 07/12/2021   HCT 43.0 07/12/2021   PLT 193.0 07/12/2021  GLUCOSE 88 07/12/2021   CHOL 143 07/12/2021   TRIG 93.0 07/12/2021   HDL 72.40 07/12/2021   LDLCALC 52 07/12/2021   ALT 18 07/12/2021   AST 22 07/12/2021   NA 138 07/12/2021   K 4.7 07/12/2021   CL 103 07/12/2021   CREATININE 0.72 07/12/2021   BUN 11 07/12/2021    CO2 30 07/12/2021   TSH 0.93 07/12/2021   PSA 0.93 07/12/2021   Assessment/Plan:  Jeff Moore is a 65 y.o. White or Caucasian [1] male with  has a past medical history of ALCOHOL ABUSE (06/04/2010), COLONIC POLYPS, HX OF (05/17/2010), Dental crowns present, DEPRESSION (05/17/2010), Femoral hernia (05/2012), History of MRSA infection (2003), and Rash (06/07/2012).  Vitamin D deficiency Last vitamin D Lab Results  Component Value Date   VD25OH 7.29 (L) 07/12/2021   Low, to start oral replacement   Encounter for well adult exam with abnormal findings Age and sex appropriate education and counseling updated with regular exercise and diet Referrals for preventative services - for colonoscopy Immunizations addressed - declines covid booster, shingrix, pneumovax but ok for flu shot today Smoking counseling  - counseled to quit, pt not ready Evidence for depression or other mood disorder - chronic anxiety Most recent labs reviewed. I have personally reviewed and have noted: 1) the patient's medical and social history 2) The patient's current medications and supplements 3) The patient's height, weight, and BMI have been recorded in the chart   Alcohol abuse Now self weaned to 3 beers per day; pt asked to abstain/quit,  to f/u any worsening symptoms or concerns   Anxiety state Overall chronic stable it seems, declines SSRI trial for now or psychiatric referral  COLONIC POLYPS, HX OF For colonoscopy f/u  Smoker Pt counseled to quit, pt not ready, also refer pulm for LDCT program  COPD (chronic obstructive pulmonary disease) (HCC) With ? Mild worsening recent, for albuterol hfa prn, refer pulmonary,  to f/u any worsening symptoms or concerns  Dyspnea Exam o/w benign, but for cxr  Perianal abscess Fortunately now much improved, no specific tx needed  Followup: Return in about 6 months (around 01/09/2022).  Oliver Barre, MD 07/19/2021 5:04 AM [facility]  Primary Care - [facility]

## 2021-07-14 NOTE — Telephone Encounter (Signed)
Left message for patient to call me back. 

## 2021-07-14 NOTE — Telephone Encounter (Signed)
Patient returning call regarding chest x-ray  Advised patient of results *see below*  Patient is requesting a call back to discuss chest CT

## 2021-07-15 NOTE — Telephone Encounter (Signed)
See result note- discussed with patient  

## 2021-07-15 NOTE — Telephone Encounter (Signed)
Patient called back to go over chest xray results  Patient says he works during the day & does not have access to his phone  Says nurse can call him in the evening around 4:30 he is usually home from work by then  Please call patient

## 2021-07-19 ENCOUNTER — Encounter: Payer: Self-pay | Admitting: Internal Medicine

## 2021-07-19 DIAGNOSIS — K610 Anal abscess: Secondary | ICD-10-CM | POA: Insufficient documentation

## 2021-07-19 NOTE — Assessment & Plan Note (Addendum)
Pt counseled to quit, pt not ready, also refer pulm for LDCT program

## 2021-07-19 NOTE — Assessment & Plan Note (Signed)
Exam o/w benign, but for cxr

## 2021-07-19 NOTE — Assessment & Plan Note (Signed)
Fortunately now much improved, no specific tx needed

## 2021-07-19 NOTE — Assessment & Plan Note (Signed)
Overall chronic stable it seems, declines SSRI trial for now or psychiatric referral

## 2021-07-19 NOTE — Assessment & Plan Note (Signed)
Now self weaned to 3 beers per day; pt asked to abstain/quit,  to f/u any worsening symptoms or concerns

## 2021-07-19 NOTE — Assessment & Plan Note (Signed)
For colonoscopy f/u 

## 2021-07-19 NOTE — Assessment & Plan Note (Addendum)
With ? Mild worsening recent, for albuterol hfa prn, refer pulmonary,  to f/u any worsening symptoms or concerns

## 2021-07-19 NOTE — Assessment & Plan Note (Addendum)
Age and sex appropriate education and counseling updated with regular exercise and diet Referrals for preventative services - for colonoscopy Immunizations addressed - declines covid booster, shingrix, pneumovax but ok for flu shot today Smoking counseling  - counseled to quit, pt not ready Evidence for depression or other mood disorder - chronic anxiety Most recent labs reviewed. I have personally reviewed and have noted: 1) the patient's medical and social history 2) The patient's current medications and supplements 3) The patient's height, weight, and BMI have been recorded in the chart

## 2021-07-23 ENCOUNTER — Telehealth: Payer: Self-pay | Admitting: Nurse Practitioner

## 2021-07-23 DIAGNOSIS — U071 COVID-19: Secondary | ICD-10-CM

## 2021-07-23 MED ORDER — NIRMATRELVIR/RITONAVIR (PAXLOVID)TABLET: 3.0000 | ORAL_TABLET | Freq: Two times a day (BID) | ORAL | 0 refills | Status: AC

## 2021-07-23 NOTE — Progress Notes (Signed)
Rescheduled

## 2021-07-23 NOTE — Patient Instructions (Signed)
You are being prescribed PAXLOVID for COVID-19 infection.      Please call the pharmacy or go through the drive through vs going inside if you are picking up the mediation yourself to prevent further spread. If prescribed to a [facility] affiliated pharmacy, a pharmacist will bring the medication out to your car.   Medications to hold while taking this treatment: none  *If asked to hold, you can resume them 24 hours after your last dose   ADMINISTRATION INSTRUCTIONS: Take with or without food. Swallow the tablets whole. Don't chew, crush, or break the medications because it might not work as well  For each dose of the medication, you should be taking 3 tablets together (2 pink oval and 1 white oval) TWICE a day for FIVE days   Finish your full five-day course of Paxlovid even if you feel better before you're done. Stopping this medication too early can make it less effective to prevent severe illness related to COVID19.    Paxlovid is prescribed for YOU ONLY. Don't share it with others, even if they have similar symptoms as you. This medication might not be right for everyone.  Make sure to take steps to protect yourself and others while you're taking this medication in order to get well soon and to prevent others from getting sick with COVID-19.  Paxlovid (nirmatrelvir / ritonavir) can cause hormonal birth control medications to not work well. If you or your partner is currently taking hormonal birth control, use condoms or other birth control methods to prevent unintended pregnancies.    COMMON SIDE EFFECTS: Altered or bad taste in your mouth  Diarrhea  High blood pressure (1% of people) Muscle aches (1% of people)     If your COVID-19 symptoms get worse, get medical help right away. Call 911 if you experience symptoms such as worsening cough, trouble breathing, chest pain that doesn't go away, confusion, a hard time staying awake, and pale or blue-colored skin. This medication  won't prevent all COVID-19 cases from getting worse.     

## 2021-07-23 NOTE — Progress Notes (Signed)
Virtual Visit Consent   Jeff Moore, you are scheduled for a virtual visit with Mary-Margaret Daphine Deutscher, FNP, a [facility] provider, today.     Just as with appointments in the office, your consent must be obtained to participate.  Your consent will be active for this visit and any virtual visit you may have with one of our providers in the next 365 days.     If you have a MyChart account, a copy of this consent can be sent to you electronically.  All virtual visits are billed to your insurance company just like a traditional visit in the office.    As this is a virtual visit, video technology does not allow for your provider to perform a traditional examination.  This may limit your provider's ability to fully assess your condition.  If your provider identifies any concerns that need to be evaluated in person or the need to arrange testing (such as labs, EKG, etc.), we will make arrangements to do so.     Although advances in technology are sophisticated, we cannot ensure that it will always work on either your end or our end.  If the connection with a video visit is poor, the visit may have to be switched to a telephone visit.  With either a video or telephone visit, we are not always able to ensure that we have a secure connection.     I need to obtain your verbal consent now.   Are you willing to proceed with your visit today? YES   Jeff Moore has provided verbal consent on 07/23/2021 for a virtual visit (video or telephone).   Mary-Margaret Daphine Deutscher, FNP   Date: 07/23/2021 4:57 PM   Virtual Visit via Video Note   I, Mary-Margaret Daphine Deutscher, connected with Jeff Moore ([id number], [birth year]) on 07/23/21 at  5:45 PM EST by a video-enabled telemedicine application and verified that I am speaking with the correct person using two identifiers.  Location: Patient: Virtual Visit Location Patient: Home Provider: Virtual Visit Location Provider: Mobile   I discussed the limitations  of evaluation and management by telemedicine and the availability of in person appointments. The patient expressed understanding and agreed to proceed.    History of Present Illness: Jeff Moore is a 65 y.o. who identifies as a male who was assigned male at birth, and is being seen today for Covid .  HPI: Patient started getting sick Tuesday evening with body aches and chills. He still has body aches and congestion. He denies cough and SOB. He tested positive Wednesday and Thursday.   Review of Systems  Constitutional:  Positive for chills, fever and malaise/fatigue.  HENT:  Positive for congestion. Negative for sore throat.   Respiratory:  Negative for cough, sputum production and shortness of breath.   Musculoskeletal:  Positive for myalgias.  Neurological:  Positive for headaches (mild).   Problems:  Patient Active Problem List   Diagnosis Date Noted   Perianal abscess 07/19/2021   Encounter for well adult exam with abnormal findings 07/12/2021   COPD (chronic obstructive pulmonary disease) (HCC) 07/12/2021   Dyspnea 07/12/2021   Vitamin D deficiency 07/12/2021   Smoker 01/04/2017   Acute pyelonephritis 10/01/2016   Cat bite of hand 02/24/2015   Femoral hernia 05/31/2012   Left inguinal hernia 05/28/2012   Rash 07/31/2011   Fatigue 01/13/2011   Insomnia 01/13/2011   Preventative health care 01/09/2011   Alcohol abuse 06/04/2010   Anxiety state 05/17/2010  Depression 05/17/2010   COLONIC POLYPS, HX OF 05/17/2010    Allergies: No Known Allergies Medications:  Current Outpatient Medications:    albuterol (VENTOLIN HFA) 108 (90 Base) MCG/ACT inhaler, Inhale 2 puffs into the lungs every 6 (six) hours as needed for wheezing or shortness of breath., Disp: 8 g, Rfl: 0   albuterol (VENTOLIN HFA) 108 (90 Base) MCG/ACT inhaler, Inhale 2 puffs into the lungs every 6 (six) hours as needed for wheezing or shortness of breath., Disp: 8 g, Rfl: 11   aspirin EC 81 MG tablet, Take 1  tablet (81 mg total) by mouth daily., Disp: 90 tablet, Rfl: 11   buPROPion (WELLBUTRIN SR) 100 MG 12 hr tablet, Take 1 tablet (100 mg total) by mouth 2 (two) times daily. (Patient not taking: Reported on 07/12/2021), Disp: 30 tablet, Rfl: 0   diazepam (VALIUM) 5 MG tablet, Take 1 tablet (5 mg total) by mouth every 6 (six) hours as needed for anxiety. (Patient not taking: Reported on 07/12/2021), Disp: 20 tablet, Rfl: 0   Multiple Vitamin (MULTIVITAMIN) tablet, Take 1 tablet by mouth daily., Disp: , Rfl:    naltrexone (DEPADE) 50 MG tablet, Take 1 tablet (50 mg total) by mouth daily. (Patient not taking: Reported on 07/12/2021), Disp: 30 tablet, Rfl: 0   TURMERIC PO, Take by mouth., Disp: , Rfl:    varenicline (CHANTIX CONTINUING MONTH PAK) 1 MG tablet, Take 1 tablet (1 mg total) by mouth 2 (two) times daily. (Patient not taking: No sig reported), Disp: 60 tablet, Rfl: 1   varenicline (CHANTIX STARTING MONTH PAK) 0.5 MG X 11 & 1 MG X 42 tablet, Take one 0.5 mg tablet by mouth once daily for 3 days, then increase to one 0.5 mg tablet twice daily for 4 days, then increase to one 1 mg tablet twice daily. (Patient not taking: No sig reported), Disp: 53 tablet, Rfl: 0  Observations/Objective: Patient is well-developed, well-nourished in no acute distress.  Resting comfortably  at home.  Head is normocephalic, atraumatic.  No labored breathing.  Speech is clear and coherent with logical content.  Patient is alert and oriented at baseline.  Face flushed  No cough during visit.  Assessment and Plan:  Jeff Moore in today with chief complaint of No chief complaint on file.   1. COVID-19 virus RNA test result positive at limit of detection 1. Take meds as prescribed 2. Use a cool mist humidifier especially during the winter months and when heat has been humid. 3. Use saline nose sprays frequently 4. Saline irrigations of the nose can be very helpful if done frequently.  * 4X daily for 1  week*  * Use of a nettie pot can be helpful with this. Follow directions with this* 5. Drink plenty of fluids 6. Keep thermostat turn down low 7.For any cough or congestion  Use plain Mucinex- regular strength or max strength is fine   * Children- consult with Pharmacist for dosing 8. For fever or aces or pains- take tylenol or ibuprofen appropriate for age and weight.  * for fevers greater than 101 orally you may alternate ibuprofen and tylenol every  3 hours.   Meds ordered this encounter  Medications   nirmatrelvir/ritonavir EUA (PAXLOVID) 20 x 150 MG & 10 x 100MG  TABS    Sig: Take 3 tablets by mouth 2 (two) times daily for 5 days. (Take nirmatrelvir 150 mg two tablets twice daily for 5 days and ritonavir 100 mg one tablet twice daily for  5 days) Patient GFR is 97.6    Dispense:  30 tablet    Refill:  0    Order Specific Question:   Supervising Provider    Answer:   Eber Hong [[id number]]       Follow Up Instructions: I discussed the assessment and treatment plan with the patient. The patient was provided an opportunity to ask questions and all were answered. The patient agreed with the plan and demonstrated an understanding of the instructions.  A copy of instructions were sent to the patient via MyChart.  The patient was advised to call back or seek an in-person evaluation if the symptoms worsen or if the condition fails to improve as anticipated.  Time:  I spent 25 minutes with the patient via telehealth technology discussing the above problems/concerns.    Mary-Margaret Daphine Deutscher, FNP

## 2021-07-26 ENCOUNTER — Telehealth: Payer: Self-pay | Admitting: Internal Medicine

## 2021-07-26 NOTE — Telephone Encounter (Signed)
[facility] FYI 07/22/2021...   Caller states positive for Covid, would like antivirals. S/s started Tuesday night. He has shortness of breath, congestion, body aches, and fever of 99-100.  Advised to go to ED now. Patient understood and chose to do a virtual appointment instead.

## 2021-08-02 ENCOUNTER — Telehealth: Payer: Self-pay | Admitting: Internal Medicine

## 2021-08-02 NOTE — Telephone Encounter (Signed)
Taron from  [employer] about authorization notated on pt. chart. Requesting a call before 12pm, so that pt appt does not have to be canceled. States she sent a message on Epic.    Callback #- [id number]

## 2021-08-03 ENCOUNTER — Ambulatory Visit
Admission: RE | Admit: 2021-08-03 | Discharge: 2021-08-03 | Disposition: A | Discharge status: Home / Self Care | Payer: 59 | Source: Ambulatory Visit | Attending: Internal Medicine | Admitting: Internal Medicine

## 2021-08-03 ENCOUNTER — Other Ambulatory Visit: Payer: Self-pay

## 2021-08-03 DIAGNOSIS — R911 Solitary pulmonary nodule: Secondary | ICD-10-CM

## 2021-08-03 MED ORDER — IOPAMIDOL (ISOVUE-300) INJECTION 61%
75.0000 mL | Freq: Once | INTRAVENOUS | Status: AC | PRN
  Administered 2021-08-03: 75 mL via INTRAVENOUS

## 2021-08-05 ENCOUNTER — Encounter: Payer: Self-pay | Admitting: Internal Medicine

## 2021-09-17 ENCOUNTER — Encounter: Payer: Self-pay | Admitting: Internal Medicine

## 2022-01-11 ENCOUNTER — Ambulatory Visit: Payer: Self-pay | Admitting: Internal Medicine

## 2022-03-09 ENCOUNTER — Encounter: Payer: Self-pay | Admitting: Internal Medicine

## 2023-09-05 ENCOUNTER — Encounter: Payer: Self-pay | Admitting: Internal Medicine

## 2023-09-05 ENCOUNTER — Ambulatory Visit (INDEPENDENT_AMBULATORY_CARE_PROVIDER_SITE_OTHER): Payer: Self-pay | Admitting: Internal Medicine

## 2023-09-05 VITALS — BP 142/86 | HR 88 | Temp 97.9°F | Ht 71.0 in | Wt 126.0 lb

## 2023-09-05 DIAGNOSIS — Z0001 Encounter for general adult medical examination with abnormal findings: Secondary | ICD-10-CM

## 2023-09-05 DIAGNOSIS — R911 Solitary pulmonary nodule: Secondary | ICD-10-CM | POA: Diagnosis not present

## 2023-09-05 DIAGNOSIS — Z125 Encounter for screening for malignant neoplasm of prostate: Secondary | ICD-10-CM | POA: Diagnosis not present

## 2023-09-05 DIAGNOSIS — E538 Deficiency of other specified B group vitamins: Secondary | ICD-10-CM | POA: Diagnosis not present

## 2023-09-05 DIAGNOSIS — R030 Elevated blood-pressure reading, without diagnosis of hypertension: Secondary | ICD-10-CM | POA: Diagnosis not present

## 2023-09-05 DIAGNOSIS — F17200 Nicotine dependence, unspecified, uncomplicated: Secondary | ICD-10-CM | POA: Diagnosis not present

## 2023-09-05 DIAGNOSIS — R739 Hyperglycemia, unspecified: Secondary | ICD-10-CM

## 2023-09-05 DIAGNOSIS — E559 Vitamin D deficiency, unspecified: Secondary | ICD-10-CM

## 2023-09-05 DIAGNOSIS — L84 Corns and callosities: Secondary | ICD-10-CM

## 2023-09-05 DIAGNOSIS — J449 Chronic obstructive pulmonary disease, unspecified: Secondary | ICD-10-CM

## 2023-09-05 LAB — LIPID PANEL
Cholesterol: 125 mg/dL (ref 0–200)
HDL: 60.5 mg/dL (ref >39.00)
LDL Cholesterol: 51 mg/dL (ref 0–99)
NonHDL: 64.57
Total CHOL/HDL Ratio: 2
Triglycerides: 67 mg/dL (ref 0.0–149.0)
VLDL: 13.4 mg/dL (ref 0.0–40.0)

## 2023-09-05 LAB — HEPATIC FUNCTION PANEL
ALT: 19 U/L (ref 0–53)
AST: 23 U/L (ref 0–37)
Albumin: 4.3 g/dL (ref 3.5–5.2)
Alkaline Phosphatase: 69 U/L (ref 39–117)
Bilirubin, Direct: 0.1 mg/dL (ref 0.0–0.3)
Total Bilirubin: 0.5 mg/dL (ref 0.2–1.2)
Total Protein: 6.7 g/dL (ref 6.0–8.3)

## 2023-09-05 LAB — CBC WITH DIFFERENTIAL/PLATELET
Basophils Absolute: 0.1 10*3/uL (ref 0.0–0.1)
Basophils Relative: 0.9 % (ref 0.0–3.0)
Eosinophils Absolute: 0.1 10*3/uL (ref 0.0–0.7)
Eosinophils Relative: 1 % (ref 0.0–5.0)
HCT: 45.8 % (ref 39.0–52.0)
Hemoglobin: 15.4 g/dL (ref 13.0–17.0)
Lymphocytes Relative: 22 % (ref 12.0–46.0)
Lymphs Abs: 1.6 10*3/uL (ref 0.7–4.0)
MCHC: 33.7 g/dL (ref 30.0–36.0)
MCV: 99.5 fL (ref 78.0–100.0)
Monocytes Absolute: 0.7 10*3/uL (ref 0.1–1.0)
Monocytes Relative: 9.3 % (ref 3.0–12.0)
Neutro Abs: 4.9 10*3/uL (ref 1.4–7.7)
Neutrophils Relative %: 66.8 % (ref 43.0–77.0)
Platelets: 233 10*3/uL (ref 150.0–400.0)
RBC: 4.61 Mil/uL (ref 4.22–5.81)
RDW: 12.7 % (ref 11.5–15.5)
WBC: 7.3 10*3/uL (ref 4.0–10.5)

## 2023-09-05 LAB — BASIC METABOLIC PANEL
BUN: 6 mg/dL (ref 6–23)
CO2: 29 meq/L (ref 19–32)
Calcium: 9.2 mg/dL (ref 8.4–10.5)
Chloride: 97 meq/L (ref 96–112)
Creatinine, Ser: 0.6 mg/dL (ref 0.40–1.50)
GFR: 100.75 mL/min (ref >60.00)
Glucose, Bld: 62 mg/dL — ABNORMAL LOW (ref 70–99)
Potassium: 4.4 meq/L (ref 3.5–5.1)
Sodium: 135 meq/L (ref 135–145)

## 2023-09-05 LAB — VITAMIN B12: Vitamin B-12: 1364 pg/mL — ABNORMAL HIGH (ref 211–911)

## 2023-09-05 LAB — TSH: TSH: 1.18 u[IU]/mL (ref 0.35–5.50)

## 2023-09-05 LAB — HEMOGLOBIN A1C: Hgb A1c MFr Bld: 5.7 % (ref 4.6–6.5)

## 2023-09-05 LAB — PSA: PSA: 2.46 ng/mL (ref 0.10–4.00)

## 2023-09-05 LAB — VITAMIN D 25 HYDROXY (VIT D DEFICIENCY, FRACTURES): VITD: 28.18 ng/mL — ABNORMAL LOW (ref 30.00–100.00)

## 2023-09-05 MED ORDER — ALBUTEROL SULFATE HFA 108 (90 BASE) MCG/ACT IN AERS: 2.0000 | INHALATION_SPRAY | Freq: Four times a day (QID) | RESPIRATORY_TRACT | 11 refills | Status: AC | PRN

## 2023-09-05 NOTE — Assessment & Plan Note (Signed)
 Due for CT chest f/u 8 mm LUL nodule

## 2023-09-05 NOTE — Progress Notes (Signed)
 The test results show that your current treatment is OK, as the tests are stable.  Please continue the same plan.  There is no other need for change of treatment or further evaluation based on these results, at this time.  thanks

## 2023-09-05 NOTE — Patient Instructions (Signed)
 Please quit smoking, and moderation in alcohol  Please check your BP on a regular basis with the goal being to be at least less than 140/90  Please continue all other medications as before, and refills have been done if requested - the albutero  Please have the pharmacy call with any other refills you may need.  Please continue your efforts at being more active, low cholesterol diet, and weight control.  You are otherwise up to date with prevention measures today.  Please keep your appointments with your specialists as you may have planned  You will be contacted regarding the referral for: podiatry, as well as CT chest   Please go to the LAB at the blood drawing area for the tests to be done  You will be contacted by phone if any changes need to be made immediately.  Otherwise, you will receive a letter about your results with an explanation, but please check with MyChart first.  Please make an Appointment to return for your 1 year visit, or sooner if needed

## 2023-09-05 NOTE — Assessment & Plan Note (Signed)
Pt counsled to quit smoking, pt not ready 

## 2023-09-05 NOTE — Assessment & Plan Note (Signed)
Also for referral podiatry 

## 2023-09-05 NOTE — Assessment & Plan Note (Signed)
 Stalbe overall , continue inhaler prn

## 2023-09-05 NOTE — Assessment & Plan Note (Signed)
 Last vitamin D Lab Results  Component Value Date   VD25OH 28.18 (L) 09/05/2023   Low, to start oral replacement

## 2023-09-05 NOTE — Progress Notes (Signed)
 Patient ID: Jeff Moore, male   DOB: [birth year], 68 y.o.   MRN: [id number]         Chief Complaint:: wellness exam and copd, smoker, low vit d, pulm nodule, corn to right lateral foot       HPI:  Jeff Moore is a 68 y.o. male here for wellness exam; still smoking but down form 2pp to 1/2 ppd; decliens immunizaitons and colonoscopy for now, o/w up to date                        Also has painful corn to right lateral foot at the 5th mtp, BP has been controlled at home.  Has hx of LUL pulm nodule, due for f/u CT chest.  Pt denies chest pain, increased sob or doe, wheezing, orthopnea, PND, increased LE swelling, palpitations, dizziness or syncope.   Pt denies polydipsia, polyuria, or new focal neuro s/s.    Pt denies fever, wt loss, night sweats, loss of appetite, or other constitutional symptoms  States only drinking 2-3 beers per day   Wt Readings from Last 3 Encounters:  09/05/23 126 lb (57.2 kg)  07/12/21 122 lb 9.6 oz (55.6 kg)  06/23/17 125 lb (56.7 kg)   BP Readings from Last 3 Encounters:  09/05/23 (!) 142/86  07/12/21 140/70  06/23/17 134/86   Immunization History  Administered Date(s) Administered   Fluad Quad(high Dose 65+) 07/19/2023   Influenza Split 06/06/2011   Influenza Whole 06/04/2010   Influenza,inj,Quad PF,6+ Mos 07/12/2021   Influenza-Unspecified 06/29/2015   PFIZER(Purple Top)SARS-COV-2 Vaccination 12/07/2019, 01/01/2020, 07/22/2020   Pfizer Covid-19 Vaccine Bivalent Booster 74yrs & up 07/19/2023   Pneumococcal Polysaccharide-23 05/29/2008   Td 04/30/2007   Tdap 01/04/2017   Health Maintenance Due  Topic Date Due   Zoster Vaccines- Shingrix (1 of 2) Never done   Pneumonia Vaccine 94+ Years old (2 of 2 - PCV) 05/29/2009   Colonoscopy  07/11/2012   Lung Cancer Screening  08/03/2022      Past Medical History:  Diagnosis Date   ALCOHOL ABUSE 06/04/2010   COLONIC POLYPS, HX OF 05/17/2010   Dental crowns present    DEPRESSION 05/17/2010   Femoral hernia  05/2012   left   History of MRSA infection 2003   Rash 06/07/2012   forehead   Past Surgical History:  Procedure Laterality Date   FEMORAL HERNIA REPAIR  06/13/2012   Procedure: HERNIA REPAIR FEMORAL;  Surgeon: Deward GORMAN Curvin DOUGLAS, MD;  Location: [facility];  Service: General;  Laterality: Left;  Left femoral hernia repair with mesh   INGUINAL HERNIA REPAIR     right   stress test negative for ischemia  Jan. 2005    reports that he has been smoking cigarettes. He has a 24 pack-year smoking history. He has never used smokeless tobacco. He reports current alcohol use. He reports that he does not use drugs. family history includes Cancer in his mother; Heart disease in his father. No Known Allergies Current Outpatient Medications on File Prior to Visit  Medication Sig Dispense Refill   Cholecalciferol (VITAMIN D3) 10 MCG (400 UNIT) CAPS Take by mouth.     Cyanocobalamin  (VITAMIN B-12) 3000 MCG SUBL Place under the tongue.     ferrous sulfate 324 MG TBEC Take 324 mg by mouth.     TURMERIC PO Take by mouth.     aspirin  EC 81 MG tablet Take 1 tablet (81 mg total) by mouth  daily. (Patient not taking: Reported on 09/05/2023) 90 tablet 11   diazepam  (VALIUM ) 5 MG tablet Take 1 tablet (5 mg total) by mouth every 6 (six) hours as needed for anxiety. (Patient not taking: Reported on 09/05/2023) 20 tablet 0   Multiple Vitamin (MULTIVITAMIN) tablet Take 1 tablet by mouth daily. (Patient not taking: Reported on 09/05/2023)     naltrexone  (DEPADE) 50 MG tablet Take 1 tablet (50 mg total) by mouth daily. (Patient not taking: Reported on 09/05/2023) 30 tablet 0   varenicline  (CHANTIX  CONTINUING MONTH PAK) 1 MG tablet Take 1 tablet (1 mg total) by mouth 2 (two) times daily. (Patient not taking: Reported on 06/23/2017) 60 tablet 1   varenicline  (CHANTIX  STARTING MONTH PAK) 0.5 MG X 11 & 1 MG X 42 tablet Take one 0.5 mg tablet by mouth once daily for 3 days, then increase to one 0.5 mg tablet twice daily  for 4 days, then increase to one 1 mg tablet twice daily. (Patient not taking: Reported on 06/23/2017) 53 tablet 0   No current facility-administered medications on file prior to visit.        ROS:  All others reviewed and negative.  Objective        PE:  BP (!) 142/86 (BP Location: Right Arm, Patient Position: Sitting, Cuff Size: Normal)   Pulse 88   Temp 97.9 F (36.6 C) (Oral)   Ht 5' 11 (1.803 m)   Wt 126 lb (57.2 kg)   SpO2 99%   BMI 17.57 kg/m                 Constitutional: Pt appears in NAD               HENT: Head: NCAT.                Right Ear: External ear normal.                 Left Ear: External ear normal.                Eyes: . Pupils are equal, round, and reactive to light. Conjunctivae and EOM are normal               Nose: without d/c or deformity               Neck: Neck supple. Gross normal ROM               Cardiovascular: Normal rate and regular rhythm.                 Pulmonary/Chest: Effort normal and breath sounds without rales or wheezing.                Abd:  Soft, NT, ND, + BS, no organomegaly               Neurological: Pt is alert. At baseline orientation, motor grossly intact               Skin: Skin is warm. No rashes, has tender painful corn to right lateral 5th mtp, LE edema - none               Psychiatric: Pt behavior is normal without agitation   Micro: none  Cardiac tracings I have personally interpreted today:  none  Pertinent Radiological findings (summarize): none   Lab Results  Component Value Date   WBC 7.3 09/05/2023   HGB 15.4 09/05/2023   HCT 45.8  09/05/2023   PLT 233.0 09/05/2023   GLUCOSE 62 (L) 09/05/2023   CHOL 125 09/05/2023   TRIG 67.0 09/05/2023   HDL 60.50 09/05/2023   LDLCALC 51 09/05/2023   ALT 19 09/05/2023   AST 23 09/05/2023   NA 135 09/05/2023   K 4.4 09/05/2023   CL 97 09/05/2023   CREATININE 0.60 09/05/2023   BUN 6 09/05/2023   CO2 29 09/05/2023   TSH 1.18 09/05/2023   PSA 2.46 09/05/2023    HGBA1C 5.7 09/05/2023   Assessment/Plan:  [name] is a 68 y.o. White or Caucasian [1] male with  has a past medical history of ALCOHOL ABUSE (06/04/2010), COLONIC POLYPS, HX OF (05/17/2010), Dental crowns present, DEPRESSION (05/17/2010), Femoral hernia (05/2012), History of MRSA infection (2003), and Rash (06/07/2012).  Encounter for well adult exam with abnormal findings Age and sex appropriate education and counseling updated with regular exercise and diet Referrals for preventative services - declines colnoscopy for now Immunizations addressed - declines all today Smoking counseling  - counsled to quit smoking, pt not ready Evidence for depression or other mood disorder - none significant Most recent labs reviewed. I have personally reviewed and have noted: 1) the patient's medical and social history 2) The patient's current medications and supplements 3) The patient's height, weight, and BMI have been recorded in the chart   COPD (chronic obstructive pulmonary disease) (HCC) Stalbe overall , continue inhaler prn  Smoker Pt counsled to quit smoking, pt not ready  Vitamin D  deficiency Last vitamin D  Lab Results  Component Value Date   VD25OH 28.18 (L) 09/05/2023   Low, to start oral replacement   Pulmonary nodule, left Due for CT chest f/u 8 mm LUL nodule  Corn Also for referral podiatry  Followup: Return in about 1 year (around 09/04/2024).  Lynwood Rush, MD 09/05/2023 8:21 PM [facility]

## 2023-09-05 NOTE — Assessment & Plan Note (Signed)
 Age and sex appropriate education and counseling updated with regular exercise and diet Referrals for preventative services - declines colnoscopy for now Immunizations addressed - declines all today Smoking counseling  - counsled to quit smoking, pt not ready Evidence for depression or other mood disorder - none significant Most recent labs reviewed. I have personally reviewed and have noted: 1) the patient's medical and social history 2) The patient's current medications and supplements 3) The patient's height, weight, and BMI have been recorded in the chart

## 2023-09-15 ENCOUNTER — Ambulatory Visit
Admission: RE | Admit: 2023-09-15 | Discharge: 2023-09-15 | Disposition: A | Discharge status: Home / Self Care | Payer: PRIVATE HEALTH INSURANCE | Source: Ambulatory Visit | Attending: Internal Medicine | Admitting: Internal Medicine

## 2023-09-15 DIAGNOSIS — J439 Emphysema, unspecified: Secondary | ICD-10-CM | POA: Diagnosis not present

## 2023-09-15 DIAGNOSIS — I700 Atherosclerosis of aorta: Secondary | ICD-10-CM | POA: Diagnosis not present

## 2023-09-15 DIAGNOSIS — R911 Solitary pulmonary nodule: Secondary | ICD-10-CM

## 2023-09-15 DIAGNOSIS — I2510 Atherosclerotic heart disease of native coronary artery without angina pectoris: Secondary | ICD-10-CM | POA: Diagnosis not present

## 2023-09-15 DIAGNOSIS — R918 Other nonspecific abnormal finding of lung field: Secondary | ICD-10-CM | POA: Diagnosis not present

## 2023-09-25 ENCOUNTER — Encounter: Payer: Self-pay | Admitting: Internal Medicine

## 2023-09-25 ENCOUNTER — Other Ambulatory Visit: Payer: Self-pay | Admitting: Internal Medicine

## 2023-09-25 DIAGNOSIS — J449 Chronic obstructive pulmonary disease, unspecified: Secondary | ICD-10-CM

## 2023-09-25 DIAGNOSIS — F17200 Nicotine dependence, unspecified, uncomplicated: Secondary | ICD-10-CM

## 2023-10-04 ENCOUNTER — Ambulatory Visit: Payer: HMO | Admitting: Podiatry

## 2023-10-04 ENCOUNTER — Encounter: Payer: Self-pay | Admitting: Podiatry

## 2023-10-04 DIAGNOSIS — Q666 Other congenital valgus deformities of feet: Secondary | ICD-10-CM

## 2023-10-04 NOTE — Progress Notes (Signed)
 Subjective:  Patient ID: Jeff Moore, male    DOB: 1955-11-27,  MRN: [id number]  Chief Complaint  Patient presents with   [facility]    RM#12 RFC patient presents today with complaints of corn on right foot around pinky toe area causing pain and discomfort.    68 y.o. male presents with the above complaint.  Patient presents with bilateral submetatarsal 5 porokeratotic lesion right greater than left side.  He wanted get it evaluated and debrided down.  He also would like to address the orthotics.  He was made in the past but since then it has worn out.  He denies any other acute complaints.  He would like to have them debrided down.  He would like to discuss getting new pair of orthotics   Review of Systems: Negative except as noted in the HPI. Denies N/V/F/Ch.  Past Medical History:  Diagnosis Date   ALCOHOL ABUSE 06/04/2010   COLONIC POLYPS, HX OF 05/17/2010   Dental crowns present    DEPRESSION 05/17/2010   Femoral hernia 05/2012   left   History of MRSA infection 2003   Rash 06/07/2012   forehead    Current Outpatient Medications:    albuterol  (VENTOLIN  HFA) 108 (90 Base) MCG/ACT inhaler, Inhale 2 puffs into the lungs every 6 (six) hours as needed for wheezing or shortness of breath., Disp: 8 g, Rfl: 11   aspirin  EC 81 MG tablet, Take 1 tablet (81 mg total) by mouth daily., Disp: 90 tablet, Rfl: 11   Cholecalciferol (VITAMIN D3) 10 MCG (400 UNIT) CAPS, Take by mouth., Disp: , Rfl:    Cyanocobalamin  (VITAMIN B-12) 3000 MCG SUBL, Place under the tongue., Disp: , Rfl:    diazepam  (VALIUM ) 5 MG tablet, Take 1 tablet (5 mg total) by mouth every 6 (six) hours as needed for anxiety., Disp: 20 tablet, Rfl: 0   ferrous sulfate 324 MG TBEC, Take 324 mg by mouth., Disp: , Rfl:    Multiple Vitamin (MULTIVITAMIN) tablet, Take 1 tablet by mouth daily., Disp: , Rfl:    naltrexone  (DEPADE) 50 MG tablet, Take 1 tablet (50 mg total) by mouth daily., Disp: 30 tablet, Rfl: 0   TURMERIC PO, Take by  mouth., Disp: , Rfl:    varenicline  (CHANTIX  CONTINUING MONTH PAK) 1 MG tablet, Take 1 tablet (1 mg total) by mouth 2 (two) times daily., Disp: 60 tablet, Rfl: 1   varenicline  (CHANTIX  STARTING MONTH PAK) 0.5 MG X 11 & 1 MG X 42 tablet, Take one 0.5 mg tablet by mouth once daily for 3 days, then increase to one 0.5 mg tablet twice daily for 4 days, then increase to one 1 mg tablet twice daily., Disp: 53 tablet, Rfl: 0  Social History   Tobacco Use  Smoking Status Every Day   Current packs/day: 0.50   Average packs/day: 0.5 packs/day for 48.0 years (24.0 ttl pk-yrs)   Types: Cigarettes  Smokeless Tobacco Never    No Known Allergies Objective:  There were no vitals filed for this visit. There is no height or weight on file to calculate BMI. Constitutional Well developed. Well nourished.  Vascular Dorsalis pedis pulses palpable bilaterally. Posterior tibial pulses palpable bilaterally. Capillary refill normal to all digits.  No cyanosis or clubbing noted. Pedal hair growth normal.  Neurologic Normal speech. Oriented to person, place, and time. Epicritic sensation to light touch grossly present bilaterally.  Dermatologic Nails well groomed and normal in appearance. No open wounds. No skin lesions.  Orthopedic: Pain  on palpation to the bilateral submetatarsal 514 acute lesion.  Painful to touch.  Central nucleated core noted.  Gait examination shows pes planovalgus with calcaneovalgus to many toe signs partially but recurred the arch with dorsiflexion of the hallux   Radiographs: None Assessment:  No diagnosis found. Plan:  Patient was evaluated and treated and all questions answered.  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  No complication noted no pinpoint bleeding noted -Pes planovalgus -I explained to patient the etiology of pes planovalgus and relationship with Planter fasciitis and  various treatment options were discussed.  Given patient foot structure in the setting of Planter fasciitis I believe patient will benefit from custom-made orthotics to help control the hindfoot motion support the arch of the foot and take the stress away from plantar fascial.  Patient agrees with the plan like to proceed with orthotics -Patient was casted for orthotics   No follow-ups on file.

## 2023-10-27 ENCOUNTER — Telehealth: Payer: Self-pay

## 2023-10-27 NOTE — Telephone Encounter (Signed)
 Left VM for orthotic pick up

## 2023-10-31 ENCOUNTER — Ambulatory Visit

## 2023-10-31 NOTE — Progress Notes (Signed)
 Patient presents today to pick up custom molded foot orthotics, diagnosed with Pes Planus by Dr. Allena Katz .   Orthotics were dispensed and fit was satisfactory. Reviewed instructions for break-in and wear. Written instructions given to patient.  Patient will follow up as needed.   Addison Bailey CPed, CFo, CFm

## 2024-09-09 ENCOUNTER — Ambulatory Visit: Payer: Self-pay | Admitting: Internal Medicine

## 2024-09-09 ENCOUNTER — Ambulatory Visit: Payer: PRIVATE HEALTH INSURANCE | Admitting: Internal Medicine

## 2024-09-09 ENCOUNTER — Encounter: Payer: Self-pay | Admitting: Internal Medicine

## 2024-09-09 VITALS — BP 130/78 | HR 70 | Temp 97.6°F | Ht 71.0 in | Wt 131.0 lb

## 2024-09-09 DIAGNOSIS — Z125 Encounter for screening for malignant neoplasm of prostate: Secondary | ICD-10-CM

## 2024-09-09 DIAGNOSIS — R739 Hyperglycemia, unspecified: Secondary | ICD-10-CM

## 2024-09-09 DIAGNOSIS — K047 Periapical abscess without sinus: Secondary | ICD-10-CM | POA: Diagnosis not present

## 2024-09-09 DIAGNOSIS — I2510 Atherosclerotic heart disease of native coronary artery without angina pectoris: Secondary | ICD-10-CM | POA: Diagnosis not present

## 2024-09-09 DIAGNOSIS — Z0001 Encounter for general adult medical examination with abnormal findings: Secondary | ICD-10-CM

## 2024-09-09 DIAGNOSIS — Z23 Encounter for immunization: Secondary | ICD-10-CM

## 2024-09-09 DIAGNOSIS — F17200 Nicotine dependence, unspecified, uncomplicated: Secondary | ICD-10-CM | POA: Diagnosis not present

## 2024-09-09 DIAGNOSIS — Z1211 Encounter for screening for malignant neoplasm of colon: Secondary | ICD-10-CM

## 2024-09-09 DIAGNOSIS — J449 Chronic obstructive pulmonary disease, unspecified: Secondary | ICD-10-CM

## 2024-09-09 DIAGNOSIS — R911 Solitary pulmonary nodule: Secondary | ICD-10-CM | POA: Insufficient documentation

## 2024-09-09 DIAGNOSIS — E559 Vitamin D deficiency, unspecified: Secondary | ICD-10-CM

## 2024-09-09 DIAGNOSIS — Z0000 Encounter for general adult medical examination without abnormal findings: Secondary | ICD-10-CM

## 2024-09-09 LAB — CBC WITH DIFFERENTIAL/PLATELET
Basophils Absolute: 0.1 K/uL (ref 0.0–0.1)
Basophils Relative: 0.9 % (ref 0.0–3.0)
Eosinophils Absolute: 0.1 K/uL (ref 0.0–0.7)
Eosinophils Relative: 2 % (ref 0.0–5.0)
HCT: 45.5 % (ref 39.0–52.0)
Hemoglobin: 15.8 g/dL (ref 13.0–17.0)
Lymphocytes Relative: 21.4 % (ref 12.0–46.0)
Lymphs Abs: 1.4 K/uL (ref 0.7–4.0)
MCHC: 34.7 g/dL (ref 30.0–36.0)
MCV: 98.8 fl (ref 78.0–100.0)
Monocytes Absolute: 0.7 K/uL (ref 0.1–1.0)
Monocytes Relative: 10.1 % (ref 3.0–12.0)
Neutro Abs: 4.4 K/uL (ref 1.4–7.7)
Neutrophils Relative %: 65.6 % (ref 43.0–77.0)
Platelets: 187 K/uL (ref 150.0–400.0)
RBC: 4.61 Mil/uL (ref 4.22–5.81)
RDW: 13.5 % (ref 11.5–15.5)
WBC: 6.7 K/uL (ref 4.0–10.5)

## 2024-09-09 LAB — URINALYSIS, ROUTINE W REFLEX MICROSCOPIC
Bilirubin Urine: NEGATIVE
Hgb urine dipstick: NEGATIVE
Ketones, ur: NEGATIVE
Leukocytes,Ua: NEGATIVE
Nitrite: NEGATIVE
RBC / HPF: NONE SEEN
Specific Gravity, Urine: <=1.005 — AB (ref 1.000–1.030)
Total Protein, Urine: NEGATIVE
Urine Glucose: NEGATIVE
Urobilinogen, UA: 0.2 (ref 0.0–1.0)
WBC, UA: NONE SEEN
pH: 6 (ref 5.0–8.0)

## 2024-09-09 LAB — LIPID PANEL
Cholesterol: 146 mg/dL (ref 28–200)
HDL: 83.3 mg/dL
LDL Cholesterol: 53 mg/dL (ref 10–99)
NonHDL: 62.44
Total CHOL/HDL Ratio: 2
Triglycerides: 46 mg/dL (ref 10.0–149.0)
VLDL: 9.2 mg/dL (ref 0.0–40.0)

## 2024-09-09 LAB — HEPATIC FUNCTION PANEL
ALT: 24 U/L (ref 3–53)
AST: 30 U/L (ref 5–37)
Albumin: 4.6 g/dL (ref 3.5–5.2)
Alkaline Phosphatase: 60 U/L (ref 39–117)
Bilirubin, Direct: 0.1 mg/dL (ref 0.1–0.3)
Total Bilirubin: 0.6 mg/dL (ref 0.2–1.2)
Total Protein: 7.5 g/dL (ref 6.0–8.3)

## 2024-09-09 LAB — BASIC METABOLIC PANEL WITH GFR
BUN: 8 mg/dL (ref 6–23)
CO2: 28 meq/L (ref 19–32)
Calcium: 9.3 mg/dL (ref 8.4–10.5)
Chloride: 100 meq/L (ref 96–112)
Creatinine, Ser: 0.68 mg/dL (ref 0.40–1.50)
GFR: 96.33 mL/min
Glucose, Bld: 67 mg/dL — ABNORMAL LOW (ref 70–99)
Potassium: 4.8 meq/L (ref 3.5–5.1)
Sodium: 135 meq/L (ref 135–145)

## 2024-09-09 LAB — TSH: TSH: 1.16 u[IU]/mL (ref 0.35–5.50)

## 2024-09-09 LAB — HEMOGLOBIN A1C: Hgb A1c MFr Bld: 5.5 % (ref 4.6–6.5)

## 2024-09-09 LAB — VITAMIN D 25 HYDROXY (VIT D DEFICIENCY, FRACTURES): VITD: 57.26 ng/mL (ref 30.00–100.00)

## 2024-09-09 MED ORDER — ACETAMINOPHEN-CODEINE 300-30 MG PO TABS: 1.0000 | ORAL_TABLET | Freq: Four times a day (QID) | ORAL | 0 refills | Status: AC | PRN

## 2024-09-09 MED ORDER — AMOXICILLIN-POT CLAVULANATE 875-125 MG PO TABS: 1.0000 | ORAL_TABLET | Freq: Two times a day (BID) | ORAL | 0 refills | Status: AC

## 2024-09-09 NOTE — Patient Instructions (Addendum)
 You had the Prevnar 20 pneumonia shot today  Please take all new medication as prescribed - the antibiotic, and pain medication  Please continue all other medications as before, and refills have been done if requested.  Please have the pharmacy call with any other refills you may need.  Please continue your efforts at being more active, low cholesterol diet, and weight control.  You are otherwise up to date with prevention measures today.  Please keep your appointments with your specialists as you may have planned  You will be contacted regarding the referral for: CT cardiac Score, CT chest without contrast, and colonoscopy  Please go to the LAB at the blood drawing area for the tests to be done  You will be contacted by phone if any changes need to be made immediately.  Otherwise, you will receive a letter about your results with an explanation, but please check with MyChart first.  Please make an Appointment to return in 6 months, or sooner if needed

## 2024-09-09 NOTE — Assessment & Plan Note (Signed)
 Also due for f/u CT chest wo CM

## 2024-09-09 NOTE — Assessment & Plan Note (Signed)
 Mild to mod, for antibx course augmentin  875 bid,  to f/u any worsening symptoms or concerns and fu with dental feb 3

## 2024-09-09 NOTE — Progress Notes (Signed)
 The test results show that your current treatment is OK, as the tests are stable.  Please continue the same plan.  There is no other need for change of treatment or further evaluation based on these results, at this time.  thanks

## 2024-09-09 NOTE — Assessment & Plan Note (Signed)
Age and sex appropriate education and counseling updated with regular exercise and diet Referrals for preventative services - for colonoscopy Immunizations addressed - for prevnar 20 today, for shingrix at pharmacy Smoking counseling  - none needed Evidence for depression or other mood disorder - none significant Most recent labs reviewed. I have personally reviewed and have noted: 1) the patient's medical and social history 2) The patient's current medications and supplements 3) The patient's height, weight, and BMI have been recorded in the chart

## 2024-09-09 NOTE — Progress Notes (Signed)
 Patient ID: Jeff Moore, male   DOB: 01/02/1956, 69 y.o.   MRN: [id number]         Chief Complaint:: wellness exam and low vit d, smoker, pulmonary nodule, coronary calcifications on CT, dental infection       HPI:  Jeff Moore is a 69 y.o. male here for wellness exam; for prevnar 20 today, for colonoscopy, for shignrix at pharmacy, o/w up to date                        Also Pt denies chest pain, increased sob or doe, wheezing, orthopnea, PND, increased LE swelling, palpitations, dizziness or syncope.   Pt denies polydipsia, polyuria, or new focal neuro s/s.    Pt denies fever, wt loss, night sweats, loss of appetite, or other constitutional symptoms Still smoking rare cigarettes, not ready to quit.  Also had several days worsening right lower jaw pain and swelling, but not able to see dental until feb 3   Wt Readings from Last 3 Encounters:  09/09/24 131 lb (59.4 kg)  09/05/23 126 lb (57.2 kg)  07/12/21 122 lb 9.6 oz (55.6 kg)   BP Readings from Last 3 Encounters:  09/09/24 130/78  09/05/23 (!) 142/86  07/12/21 140/70   Immunization History  Administered Date(s) Administered   Fluad Quad(high Dose 65+) 07/19/2023, 07/18/2024   Influenza Split 06/06/2011   Influenza Whole 06/04/2010   Influenza,inj,Quad PF,6+ Mos 07/12/2021   Influenza-Unspecified 06/29/2015   PFIZER(Purple Top)SARS-COV-2 Vaccination 12/07/2019, 01/01/2020, 07/22/2020   PNEUMOCOCCAL CONJUGATE-20 09/09/2024   Pfizer Covid-19 Vaccine Bivalent Booster 58yrs & up 07/19/2023   Pneumococcal Polysaccharide-23 05/29/2008   Td 04/30/2007   Tdap 01/04/2017   Health Maintenance Due  Topic Date Due   Medicare Annual Wellness (AWV)  Never done   Zoster Vaccines- Shingrix (1 of 2) Never done   Colonoscopy  07/11/2012   Lung Cancer Screening  09/14/2024      Past Medical History:  Diagnosis Date   ALCOHOL ABUSE 06/04/2010   COLONIC POLYPS, HX OF 05/17/2010   Dental crowns present    DEPRESSION 05/17/2010    Femoral hernia 05/2012   left   History of MRSA infection 2003   Rash 06/07/2012   forehead   Past Surgical History:  Procedure Laterality Date   FEMORAL HERNIA REPAIR  06/13/2012   Procedure: HERNIA REPAIR FEMORAL;  Surgeon: Deward GORMAN Curvin DOUGLAS, MD;  Location: [facility];  Service: General;  Laterality: Left;  Left femoral hernia repair with mesh   INGUINAL HERNIA REPAIR     right   stress test negative for ischemia  Jan. 2005    reports that he has been smoking cigarettes. He has a 24 pack-year smoking history. He has never used smokeless tobacco. He reports current alcohol use. He reports that he does not use drugs. family history includes Cancer in his mother; Heart disease in his father. Allergies[1] Medications Ordered Prior to Encounter[2]      ROS:  All others reviewed and negative.  Objective        PE:  BP 130/78 (BP Location: Right Arm, Patient Position: Sitting, Cuff Size: Normal)   Pulse 70   Temp 97.6 F (36.4 C) (Oral)   Ht 5' 11 (1.803 m)   Wt 131 lb (59.4 kg)   SpO2 98%   BMI 18.27 kg/m                 Constitutional: Pt appears  in NAD, mild ill               HENT: Head: NCAT.                Right Ear: External ear normal.                 Left Ear: External ear normal.                Eyes: . Pupils are equal, round, and reactive to light. Conjunctivae and EOM are normal;  last molar right lower jaw with red, tender gum and fractured tooth               Nose: without d/c or deformity               Neck: Neck supple. Gross normal ROM               Cardiovascular: Normal rate and regular rhythm.                 Pulmonary/Chest: Effort normal and breath sounds without rales or wheezing.                Abd:  Soft, NT, ND, + BS, no organomegaly               Neurological: Pt is alert. At baseline orientation, motor grossly intact               Skin: Skin is warm. No rashes, no other new lesions, LE edema - none               Psychiatric: Pt behavior  is normal without agitation   Micro: none  Cardiac tracings I have personally interpreted today:  none  Pertinent Radiological findings (summarize): none   Lab Results  Component Value Date   WBC 6.7 09/09/2024   HGB 15.8 09/09/2024   HCT 45.5 09/09/2024   PLT 187.0 09/09/2024   GLUCOSE 67 (L) 09/09/2024   CHOL 146 09/09/2024   TRIG 46.0 09/09/2024   HDL 83.30 09/09/2024   LDLCALC 53 09/09/2024   ALT 24 09/09/2024   AST 30 09/09/2024   NA 135 09/09/2024   K 4.8 09/09/2024   CL 100 09/09/2024   CREATININE 0.68 09/09/2024   BUN 8 09/09/2024   CO2 28 09/09/2024   TSH 1.16 09/09/2024   PSA 2.46 09/05/2023   HGBA1C 5.5 09/09/2024   Assessment/Plan:  Jeff Moore is a 69 y.o. White or Caucasian [1] male with  has a past medical history of ALCOHOL ABUSE (06/04/2010), COLONIC POLYPS, HX OF (05/17/2010), Dental crowns present, DEPRESSION (05/17/2010), Femoral hernia (05/2012), History of MRSA infection (2003), and Rash (06/07/2012).  Smoker Very rare cig these days, not ready to completely stop  Encounter for well adult exam with abnormal findings Age and sex appropriate education and counseling updated with regular exercise and diet Referrals for preventative services - for colonoscopy Immunizations addressed - for prevnar 20 today, for shingrix at pharmacy Smoking counseling  - none needed Evidence for depression or other mood disorder - none significant Most recent labs reviewed. I have personally reviewed and have noted: 1) the patient's medical and social history 2) The patient's current medications and supplements 3) The patient's height, weight, and BMI have been recorded in the chart   Coronary artery calcification seen on CT scan Asymptomatic, for CT cardiac Score to further assess quantity  COPD (chronic obstructive pulmonary disease) (HCC) Stable overall, continue inhaler  prn  Vitamin D  deficiency Last vitamin D  Lab Results  Component Value Date   VD25OH  57.26 09/09/2024   Stable, cont oral replacement   Solitary pulmonary nodule Also due for f/u CT chest wo CM  Dental infection Mild to mod, for antibx course augmentin  875 bid,  to f/u any worsening symptoms or concerns and fu with dental feb 3  Followup: No follow-ups on file.  Lynwood Rush, MD 09/09/2024 8:11 PM [facility]     [1] No Known Allergies [2]  Current Outpatient Medications on File Prior to Visit  Medication Sig Dispense Refill   albuterol  (VENTOLIN  HFA) 108 (90 Base) MCG/ACT inhaler Inhale 2 puffs into the lungs every 6 (six) hours as needed for wheezing or shortness of breath. 8 g 11   aspirin  EC 81 MG tablet Take 1 tablet (81 mg total) by mouth daily. 90 tablet 11   Cholecalciferol (VITAMIN D3) 10 MCG (400 UNIT) CAPS Take by mouth.     Cyanocobalamin  (VITAMIN B-12) 3000 MCG SUBL Place under the tongue.     diazepam  (VALIUM ) 5 MG tablet Take 1 tablet (5 mg total) by mouth every 6 (six) hours as needed for anxiety. 20 tablet 0   ferrous sulfate 324 MG TBEC Take 324 mg by mouth.     Multiple Vitamin (MULTIVITAMIN) tablet Take 1 tablet by mouth daily.     naltrexone  (DEPADE) 50 MG tablet Take 1 tablet (50 mg total) by mouth daily. 30 tablet 0   TURMERIC PO Take by mouth.     varenicline  (CHANTIX  CONTINUING MONTH PAK) 1 MG tablet Take 1 tablet (1 mg total) by mouth 2 (two) times daily. 60 tablet 1   varenicline  (CHANTIX  STARTING MONTH PAK) 0.5 MG X 11 & 1 MG X 42 tablet Take one 0.5 mg tablet by mouth once daily for 3 days, then increase to one 0.5 mg tablet twice daily for 4 days, then increase to one 1 mg tablet twice daily. 53 tablet 0   No current facility-administered medications on file prior to visit.

## 2024-09-09 NOTE — Assessment & Plan Note (Signed)
 Asymptomatic, for CT cardiac Score to further assess quantity

## 2024-09-09 NOTE — Assessment & Plan Note (Signed)
 Very rare cig these days, not ready to completely stop

## 2024-09-09 NOTE — Assessment & Plan Note (Signed)
 Last vitamin D  Lab Results  Component Value Date   VD25OH 57.26 09/09/2024   Stable, cont oral replacement

## 2024-09-09 NOTE — Assessment & Plan Note (Signed)
 Stable overall, continue inhaler prn

## 2024-09-10 ENCOUNTER — Other Ambulatory Visit: Payer: Self-pay | Admitting: Internal Medicine

## 2024-09-10 DIAGNOSIS — Z125 Encounter for screening for malignant neoplasm of prostate: Secondary | ICD-10-CM

## 2024-09-17 ENCOUNTER — Ambulatory Visit
Admission: RE | Admit: 2024-09-17 | Discharge: 2024-09-17 | Disposition: A | Payer: PRIVATE HEALTH INSURANCE | Source: Ambulatory Visit | Attending: Internal Medicine | Admitting: Internal Medicine

## 2024-09-17 DIAGNOSIS — R911 Solitary pulmonary nodule: Secondary | ICD-10-CM

## 2024-09-23 ENCOUNTER — Other Ambulatory Visit (HOSPITAL_BASED_OUTPATIENT_CLINIC_OR_DEPARTMENT_OTHER)

## 2024-09-24 ENCOUNTER — Inpatient Hospital Stay (HOSPITAL_BASED_OUTPATIENT_CLINIC_OR_DEPARTMENT_OTHER): Admission: RE | Admit: 2024-09-24 | Source: Ambulatory Visit

## 2024-09-30 ENCOUNTER — Other Ambulatory Visit (HOSPITAL_BASED_OUTPATIENT_CLINIC_OR_DEPARTMENT_OTHER)

## 2024-10-07 ENCOUNTER — Other Ambulatory Visit (HOSPITAL_BASED_OUTPATIENT_CLINIC_OR_DEPARTMENT_OTHER)

## 2025-02-03 ENCOUNTER — Ambulatory Visit: Payer: PRIVATE HEALTH INSURANCE | Admitting: Family Medicine
